# Patient Record
Sex: Male | Born: 1945 | Race: White | Hispanic: No | Smoking: Never smoker
Health system: Southern US, Community
[De-identification: ages and names within clinical notes are randomized; demographics above are authoritative.]

## PROBLEM LIST (undated history)

## (undated) DIAGNOSIS — R0989 Other specified symptoms and signs involving the circulatory and respiratory systems: Secondary | ICD-10-CM

## (undated) DIAGNOSIS — C801 Malignant (primary) neoplasm, unspecified: Secondary | ICD-10-CM

## (undated) DIAGNOSIS — E785 Hyperlipidemia, unspecified: Secondary | ICD-10-CM

## (undated) DIAGNOSIS — Z87442 Personal history of urinary calculi: Secondary | ICD-10-CM

## (undated) DIAGNOSIS — Q231 Congenital insufficiency of aortic valve: Secondary | ICD-10-CM

## (undated) DIAGNOSIS — R011 Cardiac murmur, unspecified: Secondary | ICD-10-CM

## (undated) DIAGNOSIS — M1990 Unspecified osteoarthritis, unspecified site: Secondary | ICD-10-CM

## (undated) DIAGNOSIS — N189 Chronic kidney disease, unspecified: Secondary | ICD-10-CM

## (undated) DIAGNOSIS — I480 Paroxysmal atrial fibrillation: Secondary | ICD-10-CM

## (undated) DIAGNOSIS — N200 Calculus of kidney: Secondary | ICD-10-CM

## (undated) DIAGNOSIS — I10 Essential (primary) hypertension: Secondary | ICD-10-CM

## (undated) HISTORY — PX: LITHOTRIPSY: SUR834

## (undated) HISTORY — DX: Hyperlipidemia, unspecified: E78.5

## (undated) HISTORY — DX: Other specified symptoms and signs involving the circulatory and respiratory systems: R09.89

## (undated) HISTORY — DX: Congenital insufficiency of aortic valve: Q23.1

## (undated) HISTORY — DX: Calculus of kidney: N20.0

## (undated) HISTORY — PX: EYE SURGERY: SHX253

## (undated) HISTORY — DX: Essential (primary) hypertension: I10

## (undated) HISTORY — PX: TONSILLECTOMY: SUR1361

---

## 2000-01-01 HISTORY — PX: SKIN CANCER EXCISION: SHX779

## 2013-01-05 ENCOUNTER — Encounter: Payer: Self-pay | Admitting: Vascular Surgery

## 2013-01-07 ENCOUNTER — Encounter: Payer: Self-pay | Admitting: Vascular Surgery

## 2013-01-08 ENCOUNTER — Ambulatory Visit (INDEPENDENT_AMBULATORY_CARE_PROVIDER_SITE_OTHER): Payer: Medicare Other | Admitting: Vascular Surgery

## 2013-01-08 ENCOUNTER — Encounter: Payer: Self-pay | Admitting: Vascular Surgery

## 2013-01-08 VITALS — BP 162/80 | HR 69 | Resp 20 | Ht 70.0 in | Wt 187.0 lb

## 2013-01-08 DIAGNOSIS — I6529 Occlusion and stenosis of unspecified carotid artery: Secondary | ICD-10-CM

## 2013-01-08 NOTE — Progress Notes (Signed)
Vascular and Vein Specialist of [facility]   Patient name: Kyle Kidd MRN: [id number] DOB: Sep 04, 1945 Sex: male   Referred by: Dr Jeanie Sewer  Reason for referral:  Chief Complaint  Patient presents with  . Carotid    New carotid bruit    HISTORY OF PRESENT ILLNESS: The patient is an active, pleasant 68 year old gentleman found to have a right carotid bruit by Dr. Jeanie Sewer. 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 He subsequently underwent an MRA again showing high-grade stenosis in his right internal carotid artery. I have reviewed the images from the MRA and the ultrasound. The patient describes no prior history of stroke, TIA or a fascia or amaurosis fugax. He has no history of significant coronary disease. He remains quite active.  Past Medical History  Diagnosis Date  . Hyperlipidemia   . Hypertension   . Bicuspid aortic valve   . Renal calculi     X2  . Carotid bruit     right    Past Surgical History  Procedure Date  . Skin cancer excision 2001    back  . Lithotripsy     x1    History   Social History  . Marital Status: Widowed    Spouse Name: N/A    Number of Children: N/A  . Years of Education: N/A   Occupational History  . Not on file.   Social History Main Topics  . Smoking status: Never Smoker   . Smokeless tobacco: Never Used  . Alcohol Use: No  . Drug Use: No  . Sexually Active: Not on file   Other Topics Concern  . Not on file   Social History Narrative  . No narrative on file    No family history on file.  Allergies as of 01/08/2013  . (No Known Allergies)    Current Outpatient Prescriptions on File Prior to Visit  Medication Sig Dispense Refill  . aspirin 81 MG tablet Take 81 mg by mouth daily.      Marland Kitchen olmesartan-hydrochlorothiazide (BENICAR HCT) 40-12.5 MG per tablet Take by mouth. Take 1/2 tablet every other day      . Omega-3 Fatty Acids (FISH OIL) 1200 MG  CAPS Take 2 capsules by mouth daily.      . rosuvastatin (CRESTOR) 10 MG tablet Take 20 mg by mouth. Take 1/2 daily HS      . HYDROcodone-acetaminophen (LORCET) 10-650 MG per tablet Take 1 tablet by mouth every 6 (six) hours as needed.         REVIEW OF SYSTEMS:  Positives indicated with an "X"  CARDIOVASCULAR:  [ ]  chest pain   [ ]  chest pressure   [ ]  palpitations   [ ]  orthopnea   [ ]  dyspnea on exertion   [ ]  claudication   [ ]  rest pain   [ ]  DVT   [ ]  phlebitis PULMONARY:   [ ]  productive cough   [ ]  asthma   [ ]  wheezing NEUROLOGIC:   [ ]  weakness  [ ]  paresthesias  [ ]  aphasia  [ ]  amaurosis  [ ]  dizziness HEMATOLOGIC:   [ ]  bleeding problems   [ ]  clotting disorders MUSCULOSKELETAL:  [ ]  joint pain   [ ]  joint swelling GASTROINTESTINAL: [ ]   blood in stool  [ ]   hematemesis GENITOURINARY:  [ ]   dysuria  [ ]   hematuria PSYCHIATRIC:  [ ]  history of major depression INTEGUMENTARY:  [ ]  rashes  [ ]   ulcers CONSTITUTIONAL:  [ ]  fever   [ ]  chills  PHYSICAL EXAMINATION:  General: The patient is a well-nourished male, in no acute distress. Vital signs are BP 162/80  Pulse 69  Resp 20  Ht 5\' 10"  (1.778 m)  Wt 187 lb (84.823 kg)  BMI 26.83 kg/m2 Pulmonary: There is a good air exchange bilaterally without wheezing or rales. Abdomen: Soft and non-tender with normal pitch bowel sounds. Musculoskeletal: There are no major deformities.  There is no significant extremity pain. Neurologic: No focal weakness or paresthesias are detected, Skin: There are no ulcer or rashes noted. Psychiatric: The patient has normal affect. Cardiovascular: There is a regular rate and rhythm without significant murmur appreciated. He does have a soft right carotid bruit and no breathing on the left. Pulse status is 2+ radial femoral popliteal and 2+ posterior tibial pulses bilaterally   Vascular Lab Studies:  Carotid duplex at [facility] shows markedly elevated end-diastolic velocitiesi. MRA was  reviewed also showing calcific plaque at the bifurcation. There was some nonflow limiting narrowing in the common carotid artery proximally.  Impression and Plan:  Asymptomatic greater than 75% right internal carotid artery stenosis. I had a long discussion with the patient explaining the significance of this. I explained his approximate 5% per year risk of stroke or transient ischemic attack. I explained a surgical procedure for correcting this. I explained the potential the 1-1/2% risk of stroke with carotid surgery. Also explained the slight risk for cranial nerve injury. I have recommended endarterectomy for reduction of stroke risk. He understands and wishes to consider this and will notify us if he wishes to proceed with surgery    Manar Smalling V[facility] and Vein Specialists of [place] Office: [phone]

## 2013-01-19 ENCOUNTER — Telehealth: Payer: Self-pay | Admitting: *Deleted

## 2013-01-19 NOTE — Telephone Encounter (Signed)
Josten called to say he did not want to schedule surgery at this time. He said he would contact us when he was ready to schedule.I reiterated the signs & symptoms of a stroke and to go to the ER if experienced any. He said he understood.

## 2015-03-29 DIAGNOSIS — I6529 Occlusion and stenosis of unspecified carotid artery: Secondary | ICD-10-CM | POA: Diagnosis not present

## 2015-03-29 DIAGNOSIS — N183 Chronic kidney disease, stage 3 (moderate): Secondary | ICD-10-CM | POA: Diagnosis not present

## 2015-03-29 DIAGNOSIS — I129 Hypertensive chronic kidney disease with stage 1 through stage 4 chronic kidney disease, or unspecified chronic kidney disease: Secondary | ICD-10-CM | POA: Diagnosis not present

## 2015-03-29 DIAGNOSIS — E785 Hyperlipidemia, unspecified: Secondary | ICD-10-CM | POA: Diagnosis not present

## 2015-03-29 DIAGNOSIS — D649 Anemia, unspecified: Secondary | ICD-10-CM | POA: Diagnosis not present

## 2015-05-16 DIAGNOSIS — H25813 Combined forms of age-related cataract, bilateral: Secondary | ICD-10-CM | POA: Diagnosis not present

## 2015-10-04 DIAGNOSIS — Z79899 Other long term (current) drug therapy: Secondary | ICD-10-CM | POA: Diagnosis not present

## 2015-10-04 DIAGNOSIS — E785 Hyperlipidemia, unspecified: Secondary | ICD-10-CM | POA: Diagnosis not present

## 2015-10-04 DIAGNOSIS — N183 Chronic kidney disease, stage 3 (moderate): Secondary | ICD-10-CM | POA: Diagnosis not present

## 2015-10-04 DIAGNOSIS — I129 Hypertensive chronic kidney disease with stage 1 through stage 4 chronic kidney disease, or unspecified chronic kidney disease: Secondary | ICD-10-CM | POA: Diagnosis not present

## 2016-04-10 DIAGNOSIS — Z1389 Encounter for screening for other disorder: Secondary | ICD-10-CM | POA: Diagnosis not present

## 2016-04-10 DIAGNOSIS — R5383 Other fatigue: Secondary | ICD-10-CM | POA: Diagnosis not present

## 2016-04-10 DIAGNOSIS — D510 Vitamin B12 deficiency anemia due to intrinsic factor deficiency: Secondary | ICD-10-CM | POA: Diagnosis not present

## 2016-04-10 DIAGNOSIS — Z0000 Encounter for general adult medical examination without abnormal findings: Secondary | ICD-10-CM | POA: Diagnosis not present

## 2016-04-10 DIAGNOSIS — I10 Essential (primary) hypertension: Secondary | ICD-10-CM | POA: Diagnosis not present

## 2016-04-10 DIAGNOSIS — E785 Hyperlipidemia, unspecified: Secondary | ICD-10-CM | POA: Diagnosis not present

## 2016-04-12 DIAGNOSIS — R5383 Other fatigue: Secondary | ICD-10-CM | POA: Diagnosis not present

## 2016-04-18 DIAGNOSIS — D510 Vitamin B12 deficiency anemia due to intrinsic factor deficiency: Secondary | ICD-10-CM | POA: Diagnosis not present

## 2016-04-30 DIAGNOSIS — D510 Vitamin B12 deficiency anemia due to intrinsic factor deficiency: Secondary | ICD-10-CM | POA: Diagnosis not present

## 2016-05-14 DIAGNOSIS — D510 Vitamin B12 deficiency anemia due to intrinsic factor deficiency: Secondary | ICD-10-CM | POA: Diagnosis not present

## 2016-05-24 DIAGNOSIS — H25813 Combined forms of age-related cataract, bilateral: Secondary | ICD-10-CM | POA: Diagnosis not present

## 2016-07-11 DIAGNOSIS — H02834 Dermatochalasis of left upper eyelid: Secondary | ICD-10-CM | POA: Diagnosis not present

## 2016-07-11 DIAGNOSIS — H02831 Dermatochalasis of right upper eyelid: Secondary | ICD-10-CM | POA: Diagnosis not present

## 2016-08-08 ENCOUNTER — Other Ambulatory Visit: Payer: Self-pay | Admitting: Pharmacist

## 2016-08-08 NOTE — Patient Outreach (Signed)
Outreach call to Kyle Kidd regarding his request for follow up from the [facility] Medication Adherence Campaign. Left a HIPAA compliant message on the patient's voicemail.  Harlow Asa, PharmD Clinical Pharmacist [facility] [phone]

## 2016-08-21 DIAGNOSIS — Z7982 Long term (current) use of aspirin: Secondary | ICD-10-CM | POA: Diagnosis not present

## 2016-08-21 DIAGNOSIS — I6529 Occlusion and stenosis of unspecified carotid artery: Secondary | ICD-10-CM | POA: Diagnosis not present

## 2016-08-21 DIAGNOSIS — E785 Hyperlipidemia, unspecified: Secondary | ICD-10-CM | POA: Diagnosis not present

## 2016-08-21 DIAGNOSIS — Z79899 Other long term (current) drug therapy: Secondary | ICD-10-CM | POA: Diagnosis not present

## 2016-08-21 DIAGNOSIS — I129 Hypertensive chronic kidney disease with stage 1 through stage 4 chronic kidney disease, or unspecified chronic kidney disease: Secondary | ICD-10-CM | POA: Diagnosis not present

## 2016-08-21 DIAGNOSIS — N183 Chronic kidney disease, stage 3 (moderate): Secondary | ICD-10-CM | POA: Diagnosis not present

## 2016-09-05 DIAGNOSIS — H02834 Dermatochalasis of left upper eyelid: Secondary | ICD-10-CM | POA: Diagnosis not present

## 2016-09-05 DIAGNOSIS — H02831 Dermatochalasis of right upper eyelid: Secondary | ICD-10-CM | POA: Diagnosis not present

## 2016-09-05 DIAGNOSIS — Z01818 Encounter for other preprocedural examination: Secondary | ICD-10-CM | POA: Diagnosis not present

## 2016-09-05 DIAGNOSIS — H53453 Other localized visual field defect, bilateral: Secondary | ICD-10-CM | POA: Diagnosis not present

## 2017-05-30 DIAGNOSIS — H25813 Combined forms of age-related cataract, bilateral: Secondary | ICD-10-CM | POA: Diagnosis not present

## 2017-06-25 DIAGNOSIS — Z7982 Long term (current) use of aspirin: Secondary | ICD-10-CM | POA: Diagnosis not present

## 2017-06-25 DIAGNOSIS — I6529 Occlusion and stenosis of unspecified carotid artery: Secondary | ICD-10-CM | POA: Diagnosis not present

## 2017-06-25 DIAGNOSIS — N183 Chronic kidney disease, stage 3 (moderate): Secondary | ICD-10-CM | POA: Diagnosis not present

## 2017-06-25 DIAGNOSIS — I959 Hypotension, unspecified: Secondary | ICD-10-CM | POA: Diagnosis not present

## 2017-06-25 DIAGNOSIS — E785 Hyperlipidemia, unspecified: Secondary | ICD-10-CM | POA: Diagnosis not present

## 2017-06-25 DIAGNOSIS — I129 Hypertensive chronic kidney disease with stage 1 through stage 4 chronic kidney disease, or unspecified chronic kidney disease: Secondary | ICD-10-CM | POA: Diagnosis not present

## 2017-06-25 DIAGNOSIS — Z79899 Other long term (current) drug therapy: Secondary | ICD-10-CM | POA: Diagnosis not present

## 2017-06-25 DIAGNOSIS — M791 Myalgia: Secondary | ICD-10-CM | POA: Diagnosis not present

## 2017-08-01 DIAGNOSIS — M1611 Unilateral primary osteoarthritis, right hip: Secondary | ICD-10-CM | POA: Diagnosis not present

## 2017-08-01 DIAGNOSIS — Z6827 Body mass index (BMI) 27.0-27.9, adult: Secondary | ICD-10-CM | POA: Diagnosis not present

## 2017-09-03 ENCOUNTER — Ambulatory Visit (INDEPENDENT_AMBULATORY_CARE_PROVIDER_SITE_OTHER): Payer: Medicare HMO | Admitting: Orthopaedic Surgery

## 2017-09-03 DIAGNOSIS — M25551 Pain in right hip: Secondary | ICD-10-CM

## 2017-09-03 DIAGNOSIS — M25552 Pain in left hip: Secondary | ICD-10-CM | POA: Insufficient documentation

## 2017-09-03 DIAGNOSIS — M1611 Unilateral primary osteoarthritis, right hip: Secondary | ICD-10-CM | POA: Diagnosis not present

## 2017-09-03 NOTE — Progress Notes (Signed)
Office Visit Note   Patient: Kyle Kidd           Date of Birth: 03/30/1945           MRN: [id number] Visit Date: 09/03/2017              Requested by: Angelina Sheriff, MD [place], [place] [address] PCP: Angelina Sheriff, MD   Assessment & Plan: Visit Diagnoses:  1. Unilateral primary osteoarthritis, right hip   2. Pain of right hip joint     Plan:Due to the severity of his hip disease and we are recommending a right total hip arthroplasty. The x-ray shows severe loss of the joint space at this point all conservative treatment measures have failed. I spent a considerable amount of time showing a hip model and going over his x-rays. We talked about the risks and benefits of the surgery as well as what is intraoperative and postoperative course with involved. He does wish to have this set up in the near future. All questions were encouraged and answered. We would see him back in 2 weeks postoperative but no x-rays of be needed.  Follow-Up Instructions: Return for 2 weeks post-op.   Orders:  No orders of the defined types were placed in this encounter.  No orders of the defined types were placed in this encounter.     Procedures: No procedures performed   Clinical Data: No additional findings.   Subjective: No chief complaint on file. Patient is very pleasant 72 year old sent from his primary care physicians at [facility] to evaluate severe arthritis of his right hip incision to his thighs. Walking hurts him constantly. His treatment is limited in terms of anti-inflammatories due to chronic renal insufficiency and he cannot take NSAIDs. He is tried an assistive device. He is tried activity modification. His pain is daily and it is detrimentally affected his activities daily living, his quality of life, and his mobility. He has not had intra-articular steroid injection however there is no joint space now to place an injection. He's  been treated for well over a year now for the severity of his pain. On thinking take now is narcotics.  HPI  Review of Systems He denies any headache, chest pain, short of breath, fever, chills, nausea, vomiting.  Objective: Vital Signs: There were no vitals taken for this visit.  Physical Exam He is alert and oriented 3 and in no acute distress Ortho Exam He also has significant Trendelenburg gait favoring his right hip. His leg lengths show the slightly shorter on the right than the left. He essentially has no internal or external rotation on the right hip either. Specialty Comments:  No specialty comments available.  Imaging: No results found. X-rays that accompany him and are independently reviewed by me today show severe osteoporotic and degenerative joint disease of the right hip. There is essentially no joint space left. It is completely bone-on-bone wear. There particular osteophytes and sclerotic changes as well.  PMFS History: Patient Active Problem List   Diagnosis Date Noted  . Unilateral primary osteoarthritis, right hip 09/03/2017  . Pain of right hip joint 09/03/2017  . Occlusion and stenosis of carotid artery without mention of cerebral infarction 01/08/2013   Past Medical History:  Diagnosis Date  . Bicuspid aortic valve   . Carotid bruit    right  . Hyperlipidemia   . Hypertension   . Renal calculi    X2  No family history on file.  Past Surgical History:  Procedure Laterality Date  . LITHOTRIPSY     x1  . SKIN CANCER EXCISION  2001   back   Social History   Occupational History  . Not on file.   Social History Main Topics  . Smoking status: Never Smoker  . Smokeless tobacco: Never Used  . Alcohol use No  . Drug use: No  . Sexual activity: Not on file

## 2017-09-19 ENCOUNTER — Other Ambulatory Visit (INDEPENDENT_AMBULATORY_CARE_PROVIDER_SITE_OTHER): Payer: Self-pay | Admitting: Orthopaedic Surgery

## 2017-09-19 DIAGNOSIS — M1611 Unilateral primary osteoarthritis, right hip: Secondary | ICD-10-CM

## 2017-09-23 NOTE — Pre-Procedure Instructions (Signed)
Kyle Kidd  09/23/2017      [facility] - [place], [place] [address] [place] [address] Phone: [phone] Fax: [phone]    Your procedure is scheduled on Tuesday, October 01, 2017  Report to [facility] Admitting Entrance "A" at 12:45 P.M.   Call this number if you have problems the morning of surgery:  [phone]   Remember:  Do not eat food or drink liquids after midnight.  Take these medicines the morning of surgery with A SIP OF WATER: If needed Acetaminophen (TYLENOL) for pain.  7 days before surgery stop taking all Aspirins, Vitamins, Fish oils, and Herbal medications. Also stop all NSAIDS i.e. Advil, Motrin, Aleve, Anaprox, Naproxen, BC and Goody Powders.   Do not wear jewelry.  Do not wear lotions, powders, or perfumes, or deodorant.  Do not shave 48 hours prior to surgery.  Men may shave face and neck.  Do not bring valuables to the hospital.  [facility] is not responsible for any belongings or valuables.  Contacts, dentures or bridgework may not be worn into surgery.  Leave your suitcase in the car.  After surgery it may be brought to your room.  For patients admitted to the hospital, discharge time will be determined by your treatment team.  Patients discharged the day of surgery will not be allowed to drive home.   Special instructions:  [facility]- Preparing For Surgery  Before surgery, you can play an important role. Because skin is not sterile, your skin needs to be as free of germs as possible. You can reduce the number of germs on your skin by washing with CHG (chlorahexidine gluconate) Soap before surgery.  CHG is an antiseptic cleaner which kills germs and bonds with the skin to continue killing germs even after washing.  Please do not use if you have an allergy to CHG or antibacterial soaps. If your skin becomes reddened/irritated stop using the CHG.  Do not shave (including legs and  underarms) for at least 48 hours prior to first CHG shower. It is OK to shave your face.  Please follow these instructions carefully.   1. Shower the NIGHT BEFORE SURGERY and the MORNING OF SURGERY with CHG.   2. If you chose to wash your hair, wash your hair first as usual with your normal shampoo.  3. After you shampoo, rinse your hair and body thoroughly to remove the shampoo.  4. Use CHG as you would any other liquid soap. You can apply CHG directly to the skin and wash gently with a scrungie or a clean washcloth.   5. Apply the CHG Soap to your body ONLY FROM THE NECK DOWN.  Do not use on open wounds or open sores. Avoid contact with your eyes, ears, mouth and genitals (private parts). Wash genitals (private parts) with your normal soap.  6. Wash thoroughly, paying special attention to the area where your surgery will be performed.  7. Thoroughly rinse your body with warm water from the neck down.  8. DO NOT shower/wash with your normal soap after using and rinsing off the CHG Soap.  9. Pat yourself dry with a CLEAN TOWEL.   10. Wear CLEAN PAJAMAS   11. Place CLEAN SHEETS on your bed the night of your first shower and DO NOT SLEEP WITH PETS.  Day of Surgery: Do not apply any deodorants/lotions. Please wear clean clothes to the hospital/surgery center.  Please read over the following fact sheets that you were given. Pain Booklet, Coughing and Deep Breathing, MRSA Information and Surgical Site Infection Prevention

## 2017-09-24 ENCOUNTER — Encounter (HOSPITAL_COMMUNITY)
Admission: RE | Admit: 2017-09-24 | Discharge: 2017-09-24 | Disposition: A | Discharge status: Home / Self Care | Payer: Medicare HMO | Source: Ambulatory Visit | Attending: Orthopaedic Surgery | Admitting: Orthopaedic Surgery

## 2017-09-24 ENCOUNTER — Encounter (HOSPITAL_COMMUNITY): Payer: Self-pay

## 2017-09-24 DIAGNOSIS — Z01818 Encounter for other preprocedural examination: Secondary | ICD-10-CM | POA: Diagnosis not present

## 2017-09-24 HISTORY — DX: Malignant (primary) neoplasm, unspecified: C80.1

## 2017-09-24 HISTORY — DX: Cardiac murmur, unspecified: R01.1

## 2017-09-24 HISTORY — DX: Personal history of urinary calculi: Z87.442

## 2017-09-24 HISTORY — DX: Unspecified osteoarthritis, unspecified site: M19.90

## 2017-09-24 LAB — BASIC METABOLIC PANEL
ANION GAP: 8 (ref 5–15)
BUN: 20 mg/dL (ref 6–20)
CALCIUM: 9.4 mg/dL (ref 8.9–10.3)
CO2: 26 mmol/L (ref 22–32)
CREATININE: 1.58 mg/dL — AB (ref 0.61–1.24)
Chloride: 105 mmol/L (ref 101–111)
GFR, EST AFRICAN AMERICAN: 49 mL/min — AB (ref >60)
GFR, EST NON AFRICAN AMERICAN: 42 mL/min — AB (ref >60)
Glucose, Bld: 119 mg/dL — ABNORMAL HIGH (ref 65–99)
Potassium: 4.1 mmol/L (ref 3.5–5.1)
Sodium: 139 mmol/L (ref 135–145)

## 2017-09-24 LAB — CBC
HCT: 43.8 % (ref 39.0–52.0)
HEMOGLOBIN: 14.8 g/dL (ref 13.0–17.0)
MCH: 33.1 pg (ref 26.0–34.0)
MCHC: 33.8 g/dL (ref 30.0–36.0)
MCV: 98 fL (ref 78.0–100.0)
PLATELETS: 294 10*3/uL (ref 150–400)
RBC: 4.47 MIL/uL (ref 4.22–5.81)
RDW: 13.7 % (ref 11.5–15.5)
WBC: 5.8 10*3/uL (ref 4.0–10.5)

## 2017-09-24 LAB — SURGICAL PCR SCREEN
MRSA, PCR: NEGATIVE
STAPHYLOCOCCUS AUREUS: NEGATIVE

## 2017-09-24 NOTE — Progress Notes (Addendum)
PCP is Dr. Lin Landsman in [place] 07/2017 States he was told as 'youngun' he had murmur.  No mention of it now with PCP, not even when he went into [employer] [facility] [employer]. Denies any cardiac issues or tests.  Has not seen a cardio. Has h/o of kidney stones. (15-17 yrs now), has some insufficiency.  Goes to [place] saw a Dr. Lauretta Chester back in June or July 2018.  Stays away from NSAIDS.   Been told he has some plaque buildup in carotid.

## 2017-09-25 ENCOUNTER — Other Ambulatory Visit (INDEPENDENT_AMBULATORY_CARE_PROVIDER_SITE_OTHER): Payer: Self-pay | Admitting: Orthopaedic Surgery

## 2017-09-30 MED ORDER — CEFAZOLIN SODIUM-DEXTROSE 2-4 GM/100ML-% IV SOLN
2.0000 g | INTRAVENOUS | Status: AC
  Administered 2017-10-01: 2 g via INTRAVENOUS
  Filled 2017-09-30: qty 100

## 2017-09-30 MED ORDER — TRANEXAMIC ACID 1000 MG/10ML IV SOLN
1000.0000 mg | INTRAVENOUS | Status: AC
  Administered 2017-10-01: 1000 mg via INTRAVENOUS
  Filled 2017-09-30: qty 1100

## 2017-10-01 ENCOUNTER — Inpatient Hospital Stay (HOSPITAL_COMMUNITY): Payer: Medicare HMO

## 2017-10-01 ENCOUNTER — Encounter (HOSPITAL_COMMUNITY): Payer: Self-pay

## 2017-10-01 ENCOUNTER — Inpatient Hospital Stay (HOSPITAL_COMMUNITY): Payer: Medicare HMO | Admitting: Anesthesiology

## 2017-10-01 ENCOUNTER — Encounter (HOSPITAL_COMMUNITY)
Admission: RE | Disposition: A | Discharge status: Home Health | Payer: Self-pay | Source: Ambulatory Visit | Attending: Orthopaedic Surgery

## 2017-10-01 ENCOUNTER — Inpatient Hospital Stay (HOSPITAL_COMMUNITY)
Admission: RE | Admit: 2017-10-01 | Discharge: 2017-10-03 | DRG: 470 | Disposition: A | Discharge status: Home Health | Payer: Medicare HMO | Source: Ambulatory Visit | Attending: Orthopaedic Surgery | Admitting: Orthopaedic Surgery

## 2017-10-01 DIAGNOSIS — R011 Cardiac murmur, unspecified: Secondary | ICD-10-CM | POA: Diagnosis not present

## 2017-10-01 DIAGNOSIS — Z87442 Personal history of urinary calculi: Secondary | ICD-10-CM | POA: Diagnosis not present

## 2017-10-01 DIAGNOSIS — M1611 Unilateral primary osteoarthritis, right hip: Principal | ICD-10-CM | POA: Diagnosis present

## 2017-10-01 DIAGNOSIS — Z85828 Personal history of other malignant neoplasm of skin: Secondary | ICD-10-CM | POA: Diagnosis not present

## 2017-10-01 DIAGNOSIS — I739 Peripheral vascular disease, unspecified: Secondary | ICD-10-CM | POA: Diagnosis present

## 2017-10-01 DIAGNOSIS — Z79899 Other long term (current) drug therapy: Secondary | ICD-10-CM

## 2017-10-01 DIAGNOSIS — M25551 Pain in right hip: Secondary | ICD-10-CM | POA: Diagnosis present

## 2017-10-01 DIAGNOSIS — R0989 Other specified symptoms and signs involving the circulatory and respiratory systems: Secondary | ICD-10-CM | POA: Diagnosis present

## 2017-10-01 DIAGNOSIS — Z96641 Presence of right artificial hip joint: Secondary | ICD-10-CM | POA: Diagnosis not present

## 2017-10-01 DIAGNOSIS — Z471 Aftercare following joint replacement surgery: Secondary | ICD-10-CM | POA: Diagnosis not present

## 2017-10-01 DIAGNOSIS — Z96643 Presence of artificial hip joint, bilateral: Secondary | ICD-10-CM | POA: Diagnosis not present

## 2017-10-01 DIAGNOSIS — I6529 Occlusion and stenosis of unspecified carotid artery: Secondary | ICD-10-CM | POA: Diagnosis not present

## 2017-10-01 DIAGNOSIS — I10 Essential (primary) hypertension: Secondary | ICD-10-CM | POA: Diagnosis present

## 2017-10-01 DIAGNOSIS — Z419 Encounter for procedure for purposes other than remedying health state, unspecified: Secondary | ICD-10-CM

## 2017-10-01 HISTORY — PX: TOTAL HIP ARTHROPLASTY: SHX124

## 2017-10-01 SURGERY — ARTHROPLASTY, HIP, TOTAL, ANTERIOR APPROACH
Anesthesia: Spinal | Site: Hip | Laterality: Right

## 2017-10-01 MED ORDER — SODIUM CHLORIDE 0.9 % IR SOLN
Status: DC | PRN
  Administered 2017-10-01: 3000 mL

## 2017-10-01 MED ORDER — MEPERIDINE HCL 25 MG/ML IJ SOLN: 6.2500 mg | INTRAMUSCULAR | Status: DC | PRN

## 2017-10-01 MED ORDER — CEFAZOLIN SODIUM-DEXTROSE 1-4 GM/50ML-% IV SOLN
1.0000 g | Freq: Four times a day (QID) | INTRAVENOUS | Status: AC
  Administered 2017-10-01 – 2017-10-02 (×2): 1 g via INTRAVENOUS
  Filled 2017-10-01 (×2): qty 50

## 2017-10-01 MED ORDER — DIPHENHYDRAMINE HCL 12.5 MG/5ML PO ELIX: 12.5000 mg | ORAL_SOLUTION | ORAL | Status: DC | PRN

## 2017-10-01 MED ORDER — ALBUMIN HUMAN 5 % IV SOLN
INTRAVENOUS | Status: DC | PRN
  Administered 2017-10-01: 16:00:00 via INTRAVENOUS

## 2017-10-01 MED ORDER — ROCURONIUM BROMIDE 10 MG/ML (PF) SYRINGE
PREFILLED_SYRINGE | INTRAVENOUS | Status: AC
  Filled 2017-10-01: qty 5

## 2017-10-01 MED ORDER — LIDOCAINE HCL (CARDIAC) 20 MG/ML IV SOLN
INTRAVENOUS | Status: DC | PRN
  Administered 2017-10-01: 80 mg via INTRAVENOUS

## 2017-10-01 MED ORDER — SODIUM CHLORIDE 0.9 % IV SOLN
INTRAVENOUS | Status: DC
  Administered 2017-10-01: 21:00:00 via INTRAVENOUS

## 2017-10-01 MED ORDER — IRBESARTAN 75 MG PO TABS
37.5000 mg | ORAL_TABLET | Freq: Every day | ORAL | Status: DC
  Administered 2017-10-02 – 2017-10-03 (×2): 37.5 mg via ORAL
  Filled 2017-10-01 (×3): qty 0.5

## 2017-10-01 MED ORDER — ACETAMINOPHEN 650 MG RE SUPP: 650.0000 mg | Freq: Four times a day (QID) | RECTAL | Status: DC | PRN

## 2017-10-01 MED ORDER — ALUM & MAG HYDROXIDE-SIMETH 200-200-20 MG/5ML PO SUSP: 30.0000 mL | ORAL | Status: DC | PRN

## 2017-10-01 MED ORDER — HYDROMORPHONE HCL 1 MG/ML IJ SOLN: 0.2500 mg | INTRAMUSCULAR | Status: DC | PRN

## 2017-10-01 MED ORDER — FENTANYL CITRATE (PF) 250 MCG/5ML IJ SOLN
INTRAMUSCULAR | Status: AC
  Filled 2017-10-01: qty 5

## 2017-10-01 MED ORDER — HYDROMORPHONE HCL 1 MG/ML IJ SOLN
1.0000 mg | INTRAMUSCULAR | Status: DC | PRN
  Administered 2017-10-02 (×2): 1 mg via INTRAVENOUS
  Filled 2017-10-01 (×2): qty 1

## 2017-10-01 MED ORDER — PROPOFOL 500 MG/50ML IV EMUL
INTRAVENOUS | Status: DC | PRN
  Administered 2017-10-01: 100 ug/kg/min via INTRAVENOUS

## 2017-10-01 MED ORDER — PROPOFOL 10 MG/ML IV BOLUS
INTRAVENOUS | Status: DC | PRN
  Administered 2017-10-01: 40 mg via INTRAVENOUS

## 2017-10-01 MED ORDER — POLYETHYLENE GLYCOL 3350 17 G PO PACK: 17.0000 g | PACK | Freq: Every day | ORAL | Status: DC | PRN

## 2017-10-01 MED ORDER — EPHEDRINE SULFATE 50 MG/ML IJ SOLN
INTRAMUSCULAR | Status: DC | PRN
  Administered 2017-10-01 (×2): 10 mg via INTRAVENOUS

## 2017-10-01 MED ORDER — ASPIRIN 81 MG PO CHEW
81.0000 mg | CHEWABLE_TABLET | Freq: Two times a day (BID) | ORAL | Status: DC
  Administered 2017-10-01 – 2017-10-03 (×4): 81 mg via ORAL
  Filled 2017-10-01 (×4): qty 1

## 2017-10-01 MED ORDER — BUPIVACAINE IN DEXTROSE 0.75-8.25 % IT SOLN
INTRATHECAL | Status: DC | PRN
  Administered 2017-10-01: 2 mL via INTRATHECAL

## 2017-10-01 MED ORDER — GLYCOPYRROLATE 0.2 MG/ML IJ SOLN
INTRAMUSCULAR | Status: DC | PRN
  Administered 2017-10-01: 0.2 mg via INTRAVENOUS

## 2017-10-01 MED ORDER — METOCLOPRAMIDE HCL 5 MG PO TABS: 5.0000 mg | ORAL_TABLET | Freq: Three times a day (TID) | ORAL | Status: DC | PRN

## 2017-10-01 MED ORDER — OXYCODONE HCL 5 MG PO TABS
5.0000 mg | ORAL_TABLET | ORAL | Status: DC | PRN
  Administered 2017-10-01 – 2017-10-03 (×4): 10 mg via ORAL
  Filled 2017-10-01 (×4): qty 2

## 2017-10-01 MED ORDER — METHOCARBAMOL 500 MG PO TABS
500.0000 mg | ORAL_TABLET | Freq: Four times a day (QID) | ORAL | Status: DC | PRN
  Administered 2017-10-01: 500 mg via ORAL
  Filled 2017-10-01: qty 1

## 2017-10-01 MED ORDER — ONDANSETRON HCL 4 MG/2ML IJ SOLN: 4.0000 mg | Freq: Four times a day (QID) | INTRAMUSCULAR | Status: DC | PRN

## 2017-10-01 MED ORDER — EPHEDRINE 5 MG/ML INJ
INTRAVENOUS | Status: AC
  Filled 2017-10-01: qty 10

## 2017-10-01 MED ORDER — ACETAMINOPHEN 325 MG PO TABS
650.0000 mg | ORAL_TABLET | Freq: Four times a day (QID) | ORAL | Status: DC | PRN
  Administered 2017-10-01 – 2017-10-03 (×4): 650 mg via ORAL
  Filled 2017-10-01 (×4): qty 2

## 2017-10-01 MED ORDER — MIDAZOLAM HCL 2 MG/2ML IJ SOLN
INTRAMUSCULAR | Status: AC
  Filled 2017-10-01: qty 2

## 2017-10-01 MED ORDER — 0.9 % SODIUM CHLORIDE (POUR BTL) OPTIME
TOPICAL | Status: DC | PRN
  Administered 2017-10-01: 1000 mL

## 2017-10-01 MED ORDER — ONDANSETRON HCL 4 MG/2ML IJ SOLN
INTRAMUSCULAR | Status: DC | PRN
  Administered 2017-10-01: 4 mg via INTRAVENOUS

## 2017-10-01 MED ORDER — DEXTROSE 5 % IV SOLN
500.0000 mg | Freq: Four times a day (QID) | INTRAVENOUS | Status: DC | PRN
Start: 2017-10-01 — End: 2017-10-03
  Filled 2017-10-01: qty 5

## 2017-10-01 MED ORDER — PHENYLEPHRINE 40 MCG/ML (10ML) SYRINGE FOR IV PUSH (FOR BLOOD PRESSURE SUPPORT)
PREFILLED_SYRINGE | INTRAVENOUS | Status: AC
  Filled 2017-10-01: qty 50

## 2017-10-01 MED ORDER — PHENOL 1.4 % MT LIQD
1.0000 | OROMUCOSAL | Status: DC | PRN
  Administered 2017-10-02: 1 via OROMUCOSAL
  Filled 2017-10-01: qty 177

## 2017-10-01 MED ORDER — PROPOFOL 10 MG/ML IV BOLUS
INTRAVENOUS | Status: AC
  Filled 2017-10-01: qty 20

## 2017-10-01 MED ORDER — FENTANYL CITRATE (PF) 100 MCG/2ML IJ SOLN
INTRAMUSCULAR | Status: DC | PRN
  Administered 2017-10-01: 25 ug via INTRAVENOUS

## 2017-10-01 MED ORDER — LACTATED RINGERS IV SOLN
INTRAVENOUS | Status: DC
  Administered 2017-10-01 (×3): via INTRAVENOUS

## 2017-10-01 MED ORDER — MENTHOL 3 MG MT LOZG: 1.0000 | LOZENGE | OROMUCOSAL | Status: DC | PRN

## 2017-10-01 MED ORDER — MIDAZOLAM HCL 2 MG/2ML IJ SOLN
INTRAMUSCULAR | Status: DC | PRN
  Administered 2017-10-01: 2 mg via INTRAVENOUS

## 2017-10-01 MED ORDER — METOCLOPRAMIDE HCL 5 MG/ML IJ SOLN: 5.0000 mg | Freq: Three times a day (TID) | INTRAMUSCULAR | Status: DC | PRN

## 2017-10-01 MED ORDER — DOCUSATE SODIUM 100 MG PO CAPS
100.0000 mg | ORAL_CAPSULE | Freq: Two times a day (BID) | ORAL | Status: DC
  Administered 2017-10-01 – 2017-10-03 (×4): 100 mg via ORAL
  Filled 2017-10-01 (×4): qty 1

## 2017-10-01 MED ORDER — ONDANSETRON HCL 4 MG PO TABS: 4.0000 mg | ORAL_TABLET | Freq: Four times a day (QID) | ORAL | Status: DC | PRN

## 2017-10-01 MED ORDER — PHENYLEPHRINE HCL 10 MG/ML IJ SOLN
INTRAMUSCULAR | Status: DC | PRN
  Administered 2017-10-01 (×4): 80 ug via INTRAVENOUS
  Administered 2017-10-01: 40 ug via INTRAVENOUS
  Administered 2017-10-01: 120 ug via INTRAVENOUS
  Administered 2017-10-01 (×3): 80 ug via INTRAVENOUS
  Administered 2017-10-01: 120 ug via INTRAVENOUS

## 2017-10-01 SURGICAL SUPPLY — 53 items
BENZOIN TINCTURE PRP APPL 2/3 (GAUZE/BANDAGES/DRESSINGS) ×3 IMPLANT
BLADE CLIPPER SURG (BLADE) IMPLANT
BLADE SAW SGTL 18X1.27X75 (BLADE) ×2 IMPLANT
BLADE SAW SGTL 18X1.27X75MM (BLADE) ×1
CAPT HIP TOTAL 2 ×3 IMPLANT
CELLS DAT CNTRL 66122 CELL SVR (MISCELLANEOUS) ×1 IMPLANT
CLOSURE STERI-STRIP 1/2X4 (GAUZE/BANDAGES/DRESSINGS) ×1
CLOSURE WOUND 1/2 X4 (GAUZE/BANDAGES/DRESSINGS) ×2
CLSR STERI-STRIP ANTIMIC 1/2X4 (GAUZE/BANDAGES/DRESSINGS) ×2 IMPLANT
COVER SURGICAL LIGHT HANDLE (MISCELLANEOUS) ×3 IMPLANT
DRAPE C-ARM 42X72 X-RAY (DRAPES) ×3 IMPLANT
DRAPE STERI IOBAN 125X83 (DRAPES) ×3 IMPLANT
DRAPE U-SHAPE 47X51 STRL (DRAPES) ×9 IMPLANT
DRSG AQUACEL AG ADV 3.5X10 (GAUZE/BANDAGES/DRESSINGS) ×3 IMPLANT
DURAPREP 26ML APPLICATOR (WOUND CARE) ×3 IMPLANT
ELECT BLADE 4.0 EZ CLEAN MEGAD (MISCELLANEOUS) ×3
ELECT BLADE 6.5 EXT (BLADE) IMPLANT
ELECT REM PT RETURN 9FT ADLT (ELECTROSURGICAL) ×3
ELECTRODE BLDE 4.0 EZ CLN MEGD (MISCELLANEOUS) ×1 IMPLANT
ELECTRODE REM PT RTRN 9FT ADLT (ELECTROSURGICAL) ×1 IMPLANT
FACESHIELD WRAPAROUND (MASK) ×6 IMPLANT
GLOVE BIOGEL PI IND STRL 8 (GLOVE) ×2 IMPLANT
GLOVE BIOGEL PI INDICATOR 8 (GLOVE) ×4
GLOVE ECLIPSE 8.0 STRL XLNG CF (GLOVE) ×3 IMPLANT
GLOVE ORTHO TXT STRL SZ7.5 (GLOVE) ×6 IMPLANT
GOWN STRL REUS W/ TWL LRG LVL3 (GOWN DISPOSABLE) ×2 IMPLANT
GOWN STRL REUS W/ TWL XL LVL3 (GOWN DISPOSABLE) ×2 IMPLANT
GOWN STRL REUS W/TWL LRG LVL3 (GOWN DISPOSABLE) ×4
GOWN STRL REUS W/TWL XL LVL3 (GOWN DISPOSABLE) ×4
HANDPIECE INTERPULSE COAX TIP (DISPOSABLE) ×2
KIT BASIN OR (CUSTOM PROCEDURE TRAY) ×3 IMPLANT
KIT ROOM TURNOVER OR (KITS) ×3 IMPLANT
MANIFOLD NEPTUNE II (INSTRUMENTS) ×3 IMPLANT
NS IRRIG 1000ML POUR BTL (IV SOLUTION) ×3 IMPLANT
PACK TOTAL JOINT (CUSTOM PROCEDURE TRAY) ×3 IMPLANT
PAD ARMBOARD 7.5X6 YLW CONV (MISCELLANEOUS) ×3 IMPLANT
RTRCTR WOUND ALEXIS 18CM MED (MISCELLANEOUS) ×3
SET HNDPC FAN SPRY TIP SCT (DISPOSABLE) ×1 IMPLANT
STAPLER VISISTAT 35W (STAPLE) IMPLANT
STRIP CLOSURE SKIN 1/2X4 (GAUZE/BANDAGES/DRESSINGS) ×4 IMPLANT
SUT ETHIBOND NAB CT1 #1 30IN (SUTURE) ×3 IMPLANT
SUT MNCRL AB 4-0 PS2 18 (SUTURE) IMPLANT
SUT VIC AB 0 CT1 27 (SUTURE) ×2
SUT VIC AB 0 CT1 27XBRD ANBCTR (SUTURE) ×1 IMPLANT
SUT VIC AB 1 CT1 27 (SUTURE) ×2
SUT VIC AB 1 CT1 27XBRD ANBCTR (SUTURE) ×1 IMPLANT
SUT VIC AB 2-0 CT1 27 (SUTURE) ×2
SUT VIC AB 2-0 CT1 TAPERPNT 27 (SUTURE) ×1 IMPLANT
TOWEL OR 17X24 6PK STRL BLUE (TOWEL DISPOSABLE) ×3 IMPLANT
TOWEL OR 17X26 10 PK STRL BLUE (TOWEL DISPOSABLE) ×3 IMPLANT
TRAY CATH 16FR W/PLASTIC CATH (SET/KITS/TRAYS/PACK) IMPLANT
TRAY FOLEY W/METER SILVER 16FR (SET/KITS/TRAYS/PACK) IMPLANT
WATER STERILE IRR 1000ML POUR (IV SOLUTION) ×6 IMPLANT

## 2017-10-01 NOTE — Transfer of Care (Signed)
Immediate Anesthesia Transfer of Care Note  Patient: Kyle Kidd  Procedure(s) Performed: RIGHT TOTAL HIP ARTHROPLASTY ANTERIOR APPROACH (Right Hip)  Patient Location: PACU  Anesthesia Type:Spinal  Level of Consciousness: awake, alert  and oriented  Airway & Oxygen Therapy: Patient Spontanous Breathing  Post-op Assessment: Report given to RN and Post -op Vital signs reviewed and stable  Post vital signs: Reviewed and stable  Last Vitals:  Vitals:   10/01/17 1317 10/01/17 1721  BP: (!) 178/91 (!) 89/50  Pulse:  (!) 49  Resp:  14  Temp:    SpO2:  100%    Last Pain:  Vitals:   10/01/17 1314  TempSrc: Oral      Patients Stated Pain Goal: 3 (41/03/01 3143)  Complications: No apparent anesthesia complications

## 2017-10-01 NOTE — H&P (Signed)
TOTAL HIP ADMISSION H&P  Patient is admitted for right total hip arthroplasty.  Subjective:  Chief Complaint: right hip pain  HPI: Kyle Kidd, 72 y.o. male, has a history of pain and functional disability in the right hip(s) due to arthritis and patient has failed non-surgical conservative treatments for greater than 12 weeks to include NSAID's and/or analgesics, corticosteriod injections, use of assistive devices, weight reduction as appropriate and activity modification.  Onset of symptoms was gradual starting 2 years ago with gradually worsening course since that time.The patient noted no past surgery on the right hip(s).  Patient currently rates pain in the right hip at 10 out of 10 with activity. Patient has night pain, worsening of pain with activity and weight bearing, trendelenberg gait, pain that interfers with activities of daily living, pain with passive range of motion and crepitus. Patient has evidence of subchondral cysts, subchondral sclerosis, periarticular osteophytes and joint space narrowing by imaging studies. This condition presents safety issues increasing the risk of falls.  There is no current active infection.  Patient Active Problem List   Diagnosis Date Noted  . Unilateral primary osteoarthritis, right hip 09/03/2017  . Pain of right hip joint 09/03/2017  . Occlusion and stenosis of carotid artery without mention of cerebral infarction 01/08/2013   Past Medical History:  Diagnosis Date  . Arthritis   . Bicuspid aortic valve   . Cancer ([place])    skin cancer removed from back 30 yrs ago  . Carotid bruit    right  . Heart murmur    LONG time ago...while in [employer]. No problems or mention of it since.  Marland Kitchen History of kidney stones    last stone 4-5 yrs ago  . Hyperlipidemia   . Hypertension   . Renal calculi    X2   with some insufficiency    Past Surgical History:  Procedure Laterality Date  . EYE SURGERY     lasik on OD  10 yrs ago  . LITHOTRIPSY      x1  . SKIN CANCER EXCISION  2001   back  . TONSILLECTOMY      Prescriptions Prior to Admission  Medication Sig Dispense Refill Last Dose  . acetaminophen (TYLENOL) 500 MG tablet Take 1,000 mg by mouth every 8 (eight) hours as needed for mild pain or headache.   10/01/2017 at 1000  . aspirin 81 MG tablet Take 81 mg by mouth daily.   Past Week at Unknown time  . Capsicum, Cayenne, (CAYENNE PEPPER PO) Take by mouth once.   Past Week at Unknown time  . olmesartan (BENICAR) 5 MG tablet Take 2.5 mg by mouth every other day.    10/01/2017 at 1000   No Known Allergies  Social History  Substance Use Topics  . Smoking status: Never Smoker  . Smokeless tobacco: Never Used  . Alcohol use No    History reviewed. No pertinent family history.   Review of Systems  Musculoskeletal: Positive for joint pain.  All other systems reviewed and are negative.   Objective:  Physical Exam  Constitutional: He is oriented to person, place, and time. He appears well-developed and well-nourished.  HENT:  Head: Normocephalic and atraumatic.  Eyes: Pupils are equal, round, and reactive to light. EOM are normal.  Neck: Normal range of motion. Neck supple.  Cardiovascular: Normal rate and regular rhythm.   Respiratory: Effort normal and breath sounds normal.  GI: Soft. Bowel sounds are normal.  Musculoskeletal:  Right hip: He exhibits decreased range of motion, decreased strength, tenderness and bony tenderness.  Neurological: He is alert and oriented to person, place, and time.  Skin: Skin is warm and dry.  Psychiatric: He has a normal mood and affect.    Vital signs in last 24 hours: Temp:  [97.7 F (36.5 C)] 97.7 F (36.5 C) (10/02 1314) Pulse Rate:  [81] 81 (10/02 1314) Resp:  [20] 20 (10/02 1314) BP: (178)/(91) 178/91 (10/02 1317) SpO2:  [98 %] 98 % (10/02 1314) Weight:  [189 lb (85.7 kg)] 189 lb (85.7 kg) (10/02 1314)  Labs:   Estimated body mass index is 27.12 kg/m as calculated  from the following:   Height as of this encounter: 5\' 10"  (1.778 m).   Weight as of this encounter: 189 lb (85.7 kg).   Imaging Review Plain radiographs demonstrate severe degenerative joint disease of the right hip(s). The bone quality appears to be good for age and reported activity level.  Assessment/Plan:  End stage arthritis, right hip(s)  The patient history, physical examination, clinical judgement of the provider and imaging studies are consistent with end stage degenerative joint disease of the right hip(s) and total hip arthroplasty is deemed medically necessary. The treatment options including medical management, injection therapy, arthroscopy and arthroplasty were discussed at length. The risks and benefits of total hip arthroplasty were presented and reviewed. The risks due to aseptic loosening, infection, stiffness, dislocation/subluxation,  thromboembolic complications and other imponderables were discussed.  The patient acknowledged the explanation, agreed to proceed with the plan and consent was signed. Patient is being admitted for inpatient treatment for surgery, pain control, PT, OT, prophylactic antibiotics, VTE prophylaxis, progressive ambulation and ADL's and discharge planning.The patient is planning to be discharged home with home health services

## 2017-10-01 NOTE — Anesthesia Procedure Notes (Signed)
Spinal  Patient location during procedure: OR Start time: 10/01/2017 3:26 PM Staffing Anesthesiologist: Josephine Igo Performed: anesthesiologist  Preanesthetic Checklist Completed: patient identified, site marked, surgical consent, pre-op evaluation, timeout performed, IV checked, risks and benefits discussed and monitors and equipment checked Spinal Block Patient position: sitting Prep: site prepped and draped and DuraPrep Patient monitoring: heart rate, cardiac monitor, continuous pulse ox and blood pressure Approach: midline Location: L3-4 Injection technique: single-shot Needle Needle type: Pencan  Needle gauge: 24 G Needle length: 9 cm Needle insertion depth: 7 cm Assessment Sensory level: T4 Additional Notes Patient tolerated procedure well. Adequate sensory block.

## 2017-10-01 NOTE — Anesthesia Preprocedure Evaluation (Addendum)
Anesthesia Evaluation  Patient identified by MRN, date of birth, ID band Patient awake    Reviewed: Allergy & Precautions, NPO status , Patient's Chart, lab work & pertinent test results  Airway Mallampati: II  TM Distance: >3 FB Neck ROM: Full    Dental no notable dental hx. (+) Teeth Intact   Pulmonary neg pulmonary ROS,    Pulmonary exam normal breath sounds clear to auscultation       Cardiovascular hypertension, Pt. on medications + Peripheral Vascular Disease  Normal cardiovascular exam+ Valvular Problems/Murmurs  Rhythm:Regular Rate:Normal + Carotid Bruit Right Carotid stenosis   Neuro/Psych negative neurological ROS  negative psych ROS   GI/Hepatic negative GI ROS, Neg liver ROS,   Endo/Other  negative endocrine ROS  Renal/GU Renal InsufficiencyRenal diseaseHx/o renal calculi  negative genitourinary   Musculoskeletal  (+) Arthritis , Osteoarthritis,  OA Right Hip   Abdominal   Peds  Hematology negative hematology ROS (+)   Anesthesia Other Findings   Reproductive/Obstetrics                           Anesthesia Physical Anesthesia Plan  ASA: III  Anesthesia Plan: Spinal   Post-op Pain Management:    Induction:   PONV Risk Score and Plan: 2 and Ondansetron, Dexamethasone and Propofol infusion  Airway Management Planned: Nasal Cannula and Natural Airway  Additional Equipment:   Intra-op Plan:   Post-operative Plan:   Informed Consent: I have reviewed the patients History and Physical, chart, labs and discussed the procedure including the risks, benefits and alternatives for the proposed anesthesia with the patient or authorized representative who has indicated his/her understanding and acceptance.   Dental advisory given  Plan Discussed with: Anesthesiologist, Surgeon and CRNA  Anesthesia Plan Comments:        Anesthesia Quick Evaluation

## 2017-10-01 NOTE — Brief Op Note (Signed)
10/01/2017  4:57 PM  PATIENT:  Larwance Rote  72 y.o. male  PRE-OPERATIVE DIAGNOSIS:  osteoarthritis right hip  POST-OPERATIVE DIAGNOSIS:  osteoarthritis right hip  PROCEDURE:  Procedure(s): RIGHT TOTAL HIP ARTHROPLASTY ANTERIOR APPROACH (Right)  SURGEON:  Surgeon(s) and Role:    Mcarthur Rossetti, MD - Primary  PHYSICIAN ASSISTANT: Benita Stabile, PA-C  ANESTHESIA:  spinal  EBL:  Total I/O In: 1250 [I.V.:1000; IV Piggyback:250] Out: 350 [Blood:350]  COUNTS:  YES  DICTATION: .Other Dictation: Dictation Number [id number]  PLAN OF CARE: Admit to inpatient   PATIENT DISPOSITION:  PACU - hemodynamically stable.   Delay start of Pharmacological VTE agent (>24hrs) due to surgical blood loss or risk of bleeding: no

## 2017-10-02 ENCOUNTER — Encounter (HOSPITAL_COMMUNITY): Payer: Self-pay | Admitting: Orthopaedic Surgery

## 2017-10-02 LAB — CBC
HEMATOCRIT: 35.9 % — AB (ref 39.0–52.0)
Hemoglobin: 11.8 g/dL — ABNORMAL LOW (ref 13.0–17.0)
MCH: 32.6 pg (ref 26.0–34.0)
MCHC: 32.9 g/dL (ref 30.0–36.0)
MCV: 99.2 fL (ref 78.0–100.0)
Platelets: 240 10*3/uL (ref 150–400)
RBC: 3.62 MIL/uL — ABNORMAL LOW (ref 4.22–5.81)
RDW: 14.3 % (ref 11.5–15.5)
WBC: 6.5 10*3/uL (ref 4.0–10.5)

## 2017-10-02 LAB — BASIC METABOLIC PANEL
Anion gap: 8 (ref 5–15)
BUN: 11 mg/dL (ref 6–20)
CALCIUM: 8.5 mg/dL — AB (ref 8.9–10.3)
CHLORIDE: 103 mmol/L (ref 101–111)
CO2: 27 mmol/L (ref 22–32)
CREATININE: 1.52 mg/dL — AB (ref 0.61–1.24)
GFR calc non Af Amer: 44 mL/min — ABNORMAL LOW (ref >60)
GFR, EST AFRICAN AMERICAN: 51 mL/min — AB (ref >60)
GLUCOSE: 137 mg/dL — AB (ref 65–99)
Potassium: 4.8 mmol/L (ref 3.5–5.1)
Sodium: 138 mmol/L (ref 135–145)

## 2017-10-02 NOTE — Anesthesia Postprocedure Evaluation (Signed)
Anesthesia Post Note  Patient: Kyle Kidd  Procedure(s) Performed: RIGHT TOTAL HIP ARTHROPLASTY ANTERIOR APPROACH (Right Hip)     Patient location during evaluation: PACU Anesthesia Type: Spinal Level of consciousness: oriented and awake and alert Pain management: pain level controlled Vital Signs Assessment: post-procedure vital signs reviewed and stable Respiratory status: spontaneous breathing, respiratory function stable and nonlabored ventilation Cardiovascular status: blood pressure returned to baseline and stable Postop Assessment: no headache, no backache, no apparent nausea or vomiting, spinal receding and patient able to bend at knees Anesthetic complications: no    Last Vitals:  Vitals:   10/01/17 1945 10/01/17 2000  BP:  (!) 154/91  Pulse: 71 67  Resp: 17 18  Temp: 36.7 C (!) 36.4 C  SpO2: 98% 95%    Last Pain:  Vitals:   10/02/17 0038  TempSrc:   PainSc: 2                  Ermalinda Joubert A.

## 2017-10-02 NOTE — Evaluation (Signed)
Physical Therapy Evaluation Patient Details Name: Kyle Kidd MRN: [id number] DOB: [birth year] Today's Date: 10/02/2017   History of Present Illness  Pt is a 72 y.o. male now s/p elective R THA (direct anterior approach) on 10/01/17. Pertinent PMH includes HTN, HLD, arthritis, heart murmur.     Clinical Impression  Pt presents with pain and an overall decrease in functional mobility secondary to above. PTA, pt indep and lives at home alone; family plans to live with pt for initial assist upon d/c home. Educ on precautions, positioning, therex, and importance of mobility. Today, pt able to transfer and amb with RW and min guard for balance; very pleasant and motivated to return to PLOF. Will plan for stair training and therex progression this afternoon. Pt would benefit from continued acute PT services to maximize functional mobility and independence prior to d/c with HHPT.       Follow Up Recommendations DC plan and follow up therapy as arranged by surgeon;Home health PT    Equipment Recommendations  None recommended by PT (owns DME)    Recommendations for Other Services OT consult     Precautions / Restrictions Precautions Precautions: None Restrictions Weight Bearing Restrictions: Yes RLE Weight Bearing: Weight bearing as tolerated      Mobility  Bed Mobility Overal bed mobility: Needs Assistance Bed Mobility: Supine to Sit     Supine to sit: Supervision;HOB elevated     General bed mobility comments: No physical assist required  Transfers Overall transfer level: Needs assistance Equipment used: Rolling walker (2 wheeled) Transfers: Sit to/from Stand Sit to Stand: Min guard         General transfer comment: Cues for hand placement and technique with RW; initially dependent on momentum to stand secondary to R hip pain.   Ambulation/Gait Ambulation/Gait assistance: Min guard Ambulation Distance (Feet): 200 Feet Assistive device: Rolling walker (2  wheeled) Gait Pattern/deviations: Step-through pattern;Decreased stride length;Decreased weight shift to right;Antalgic;Trunk flexed Gait velocity: Decreased Gait velocity interpretation: <1.8 ft/sec, indicative of risk for recurrent falls General Gait Details: Slow, controlled gait with decreased weight shift to RLE secondary to increased pain. Cues to decrease WB through BUEs to encourage increased WB through R hip. Cues for upright posture  Stairs            Wheelchair Mobility    Modified Rankin (Stroke Patients Only)       Balance Overall balance assessment: Needs assistance Sitting-balance support: No upper extremity supported;Feet supported Sitting balance-Leahy Scale: Good     Standing balance support: No upper extremity supported;During functional activity;Bilateral upper extremity supported Standing balance-Leahy Scale: Fair Standing balance comment: Able to static stand with no UE support                             Pertinent Vitals/Pain Pain Assessment: Faces Faces Pain Scale: Hurts a little bit Pain Location: R hip Pain Descriptors / Indicators: Discomfort;Grimacing Pain Intervention(s): Monitored during session;Repositioned;Ice applied    Home Living Family/patient expects to be discharged to:: Private residence Living Arrangements: Alone Available Help at Discharge: Family;Available 24 hours/day Type of Home: House Home Access: Stairs to enter Entrance Stairs-Rails: Left (post) Entrance Stairs-Number of Steps: 2 Home Layout: One level Home Equipment: Walker - 2 wheels;Bedside commode;Other (comment) (lift recliner) Additional Comments: Pt's cousin plans to live with him at d/c to help 24/7 if needed initially    Prior Function Level of Independence: Independent  Hand Dominance        Extremity/Trunk Assessment   Upper Extremity Assessment Upper Extremity Assessment: Overall WFL for tasks assessed    Lower  Extremity Assessment Lower Extremity Assessment: RLE deficits/detail RLE Deficits / Details: s/p R THA; hip flexion grossly 3/5       Communication   Communication: No difficulties  Cognition Arousal/Alertness: Awake/alert Behavior During Therapy: WFL for tasks assessed/performed Overall Cognitive Status: Within Functional Limits for tasks assessed                                        General Comments      Exercises Total Joint Exercises Ankle Circles/Pumps: AROM;Both;10 reps;Supine Long Arc Quad: AROM;Right;20 reps;Seated Marching in Standing: AROM;Right;20 reps;Seated   Assessment/Plan    PT Assessment Patient needs continued PT services  PT Problem List Decreased strength;Decreased range of motion;Decreased activity tolerance;Decreased balance;Decreased mobility;Decreased knowledge of use of DME;Pain       PT Treatment Interventions DME instruction;Gait training;Stair training;Functional mobility training;Therapeutic activities;Therapeutic exercise;Balance training;Patient/family education    PT Goals (Current goals can be found in the Care Plan section)  Acute Rehab PT Goals Patient Stated Goal: Return home PT Goal Formulation: With patient Time For Goal Achievement: 10/16/17 Potential to Achieve Goals: Good    Frequency 7X/week   Barriers to discharge        Co-evaluation               AM-PAC PT "6 Clicks" Daily Activity  Outcome Measure Difficulty turning over in bed (including adjusting bedclothes, sheets and blankets)?: A Little Difficulty moving from lying on back to sitting on the side of the bed? : A Little Difficulty sitting down on and standing up from a chair with arms (e.g., wheelchair, bedside commode, etc,.)?: A Little Help needed moving to and from a bed to chair (including a wheelchair)?: A Little Help needed walking in hospital room?: A Little Help needed climbing 3-5 steps with a railing? : A Little 6 Click Score:  18    End of Session Equipment Utilized During Treatment: Gait belt Activity Tolerance: Patient tolerated treatment well Patient left: in chair;with call bell/phone within reach Nurse Communication: Mobility status PT Visit Diagnosis: Other abnormalities of gait and mobility (R26.89);Pain Pain - Right/Left: Right Pain - part of body: Hip    Time: 5456-2563 PT Time Calculation (min) (ACUTE ONLY): 24 min   Charges:   PT Evaluation $PT Eval Low Complexity: 1 Low PT Treatments $Gait Training: 8-22 mins   PT G Codes:       Mabeline Caras, PT, DPT Acute Rehab Services  Pager: [place] 10/02/2017, 8:44 AM

## 2017-10-02 NOTE — Evaluation (Signed)
Occupational Therapy Evaluation Patient Details Name: Kyle Kidd MRN: [id number] DOB: 1945/03/18 Today's Date: 10/02/2017    History of Present Illness Pt is a 72 y.o. male now s/p elective R THA (direct anterior approach) on 10/01/17. Pertinent PMH includes HTN, HLD, arthritis, heart murmur.    Clinical Impression   Pt is currently functioning at a set up to supervision level in ADL and ADL transfers. Educated in compensatory strategies for LB dressing, safe footwear, technique for tub transfer, multiple uses of 3 in 1 and how to transport items safely with RW. No further OT needs.     Follow Up Recommendations  No OT follow up    Equipment Recommendations  None recommended by OT    Recommendations for Other Services       Precautions / Restrictions Precautions Precautions: None Restrictions Weight Bearing Restrictions: Yes RLE Weight Bearing: Weight bearing as tolerated      Mobility Bed Mobility               General bed mobility comments: pt in chair  Transfers Overall transfer level: Needs assistance Equipment used: Rolling walker (2 wheeled) Transfers: Sit to/from Stand Sit to Stand: Supervision         General transfer comment: good technique    Balance Overall balance assessment: Needs assistance   Sitting balance-Leahy Scale: Good       Standing balance-Leahy Scale: Fair                             ADL either performed or assessed with clinical judgement   ADL Overall ADL's : Needs assistance/impaired Eating/Feeding: Independent;Sitting   Grooming: Wash/dry hands;Standing;Supervision/safety   Upper Body Bathing: Set up;Sitting   Lower Body Bathing: Supervison/ safety;Sit to/from stand   Upper Body Dressing : Set up;Sitting   Lower Body Dressing: Supervision/safety;Sit to/from stand   Toilet Transfer: Supervision/safety;RW;Ambulation Armed forces technical officer Details (indicate cue type and reason): pt is aware of use of 3 in  1 over toilet Toileting- Clothing Manipulation and Hygiene: Supervision/safety;Sit to/from Nurse, children's Details (indicate cue type and reason): demonstrated technique, educated pt in use of 3 in 1 as shower seat Functional mobility during ADLs: Supervision/safety;Rolling walker       Vision Baseline Vision/History: Wears glasses Wears Glasses: Reading only Patient Visual Report: No change from baseline       Perception     Praxis      Pertinent Vitals/Pain Pain Assessment: 0-10 Pain Score: 4  Pain Location: R hip Pain Descriptors / Indicators: Sore Pain Intervention(s): Monitored during session;Patient requesting pain meds-RN notified;Ice applied;Repositioned     Hand Dominance Right   Extremity/Trunk Assessment Upper Extremity Assessment Upper Extremity Assessment: Overall WFL for tasks assessed   Lower Extremity Assessment Lower Extremity Assessment: Defer to PT evaluation       Communication Communication Communication: No difficulties   Cognition Arousal/Alertness: Awake/alert Behavior During Therapy: WFL for tasks assessed/performed Overall Cognitive Status: Within Functional Limits for tasks assessed                                     General Comments       Exercises     Shoulder Instructions      Home Living Family/patient expects to be discharged to:: Private residence Living Arrangements: Alone Available Help at Discharge: Family;Available 24 hours/day Type of  Home: House Home Access: Stairs to enter [employer] of Steps: 2 Entrance Stairs-Rails: Left Home Layout: One level     Bathroom Shower/Tub: Teacher, early years/pre: Standard     Home Equipment: Environmental consultant - 2 wheels;Bedside commode;Other (comment) (lift chair)   Additional Comments: Pt's cousin plans to live with him at d/c to help 24/7 if needed initially      Prior Functioning/Environment Level of Independence: Independent                  OT Problem List:        OT Treatment/Interventions:      OT Goals(Current goals can be found in the care plan section) Acute Rehab OT Goals Patient Stated Goal: Return home  OT Frequency:     Barriers to D/C:            Co-evaluation              AM-PAC PT "6 Clicks" Daily Activity     Outcome Measure Help from another person eating meals?: None Help from another person taking care of personal grooming?: None Help from another person toileting, which includes using toliet, bedpan, or urinal?: A Little Help from another person bathing (including washing, rinsing, drying)?: A Little Help from another person to put on and taking off regular upper body clothing?: None Help from another person to put on and taking off regular lower body clothing?: A Little 6 Click Score: 21   End of Session Equipment Utilized During Treatment: Gait belt;Rolling walker Nurse Communication: Patient requests pain meds  Activity Tolerance: Patient tolerated treatment well Patient left: in chair;with call bell/phone within reach;with family/visitor present  OT Visit Diagnosis: Other abnormalities of gait and mobility (R26.89)                Time: 6060-0459 OT Time Calculation (min): 18 min Charges:  OT General Charges $OT Visit: 1 Visit OT Evaluation $OT Eval Low Complexity: 1 Low G-Codes:     Malka So 10/02/2017, 1:32 PM  10/02/2017 Nestor Lewandowsky, OTR/L Pager: [phone]

## 2017-10-02 NOTE — Op Note (Signed)
NAME:  BARAA, TUBBS NO.:  MEDICAL RECORD NO.:  [id number]  LOCATION:                                 FACILITY:  PHYSICIAN:  Lind Guest. Ninfa Linden, M.D.DATE OF BIRTH:  DATE OF PROCEDURE:  10/01/2017 DATE OF DISCHARGE:                              OPERATIVE REPORT   PREOPERATIVE DIAGNOSIS:  Primary osteoarthritis and degenerative joint disease, right hip.  POSTOPERATIVE DIAGNOSIS:  Primary osteoarthritis and degenerative joint disease, right hip.  PROCEDURE:  Right total hip arthroplasty through direct anterior approach.  IMPLANTS:  DePuy Sector Gription acetabular component size 62 with 3 screws, size 36+ 4 polyethylene liner, size 12 Corail femoral component with varus offset, size 36+ 5 ceramic hip ball.  SURGEON:  Lind Guest. Ninfa Linden, M.D.  ASSISTANT:  Erskine Emery, PA-C  ANESTHESIA:  Spinal.  ANTIBIOTICS:  2 g of IV Ancef.  BLOOD LOSS:  250 mL.  COMPLICATIONS:  None.  INDICATIONS:  Mr. Kyle Kidd is a 72 year old gentleman who is very active with debilitating arthritis involving his right hip.  His right hip is significantly diseased with x-ray showing complete loss of the joint space and significant periarticular osteophytes with subluxation of the hip as well as joint space narrowing.  His leg is actually shorter on his right side than the left as well.  At this point, his pain is daily and it is detrimentally affected his activities of daily living, his quality of life and his mobility.  At this point, he does wish to proceed with a total hip arthroplasty through direct anterior approach.  We had a long discussion with him about the risks and benefits of surgery as well as our recommendation.  We talked about the risks of acute blood loss anemia, nerve and vessel injury, fracture, infection, dislocation, DVT.  He understands our goals are to decrease pain, improve mobility and overall improved quality of life.  PROCEDURE  DESCRIPTION:  After informed consent was obtained, appropriate right hip was marked.  He was brought to the operating room where spinal anesthesia was obtained while he was on the stretcher.  He was then laid in a supine position, traction boots were placed on both of his feet. Next, he was placed supine on the Hana fracture table with the perineal post in place and both legs in inline skeletal traction devices, but no traction applied.  His right operative hip was prepped and draped with DuraPrep and sterile drapes.  A time-out was called and he was identified as correct patient and correct right hip.  I then made a standard anterior approach of the hip with an incision just inferior and posterior to the anterior superior iliac spine and carried this obliquely down the leg.  We dissected down the tensor fascia lata muscle.  The tensor fascia was then divided longitudinally to proceed with a direct approach to the hip.  We identified and cauterized the circumflex vessels and then identified the hip capsule.  We opened up the hip capsule in an L-type format, finding a large joint effusion and significant disease in his hip.  We removed the large loose bodies as well.  We placed Cobra retractors on the  medial and lateral femoral neck and then made our femoral neck cut with an oscillating saw and completed this with an osteotome, placed a corkscrew guide in the femoral head and removed the femoral head in its entirety and found it to be completely devoid of cartilage.  We removed more loose debris in the acetabulum. We then cleaned the acetabulum and remnants of the acetabular labrum and other debris.  I placed a bent Hohmann over the medial acetabular rim and then began reaming from a size 42 reamer jumping all the way up to several increments, then finally finishing with the 62 with all reamers under direct visualization and the last reamer under direct fluoroscopy, so we could obtain our  depth of reaming, our inclination and anteversion.  Once we were pleased with this, we placed the real DePuy Sector Gription acetabular component size 62 and I did place three screws as well.  We then placed a 36+ 4 polyethylene liner for that size acetabular component.  Attention was then turned to the femur.  With the leg externally rotated to 120 degrees, extended and adducted, we were able to use a Mueller retractor medially and a Hohmann retractor behind the greater trochanter.  We then released the lateral joint capsule and used a box cutting osteotome to enter the femoral canal and a rongeur to lateralize.  We then began broaching using the Corail broaching system from a size 8 broach going up to the size 12.  With the size 12 in place, we had a nice fill with the canal, we trialed a varus offset femoral neck and a 36+ 1.5 hip ball.  We brought the leg back over and up with traction and internal rotation reducing the pelvis, and we were pleased with stability, but we felt like we needed just a little bit more leg length and offset.  We then dislocated the hip and removed the trial components.  We were able to place the real Corail femoral component with varus offset, size 12 and then real 36+ 5 ceramic hip ball.  We reduced this in the acetabulum.  Again, we were pleased with stability, leg length and offset.  We then irrigated the soft tissue with normal saline solution using pulsatile lavage.  We were able to close the joint capsule with interrupted #1 Ethibond suture followed by running #1 Vicryl in the tensor fascia, 0 Vicryl in the deep tissue, 2-0 Vicryl in the subcutaneous tissue, 4-0 Monocryl subcuticular stitch and Steri-Strips on the skin.  An Aquacel dressing was applied.  He was taken off the Hana table and taken to the recovery room in stable condition.  All final counts were correct.  There were no complications noted.  Of note, Erskine Emery, PA-C, assisted in the  entire case.  His assistance was crucial for facilitating all aspects of this case.     Lind Guest. Ninfa Linden, M.D.     CYB/MEDQ  D:  10/01/2017  T:  10/02/2017  Job:  [id number]

## 2017-10-02 NOTE — Progress Notes (Signed)
Physical Therapy Treatment Patient Details Name: Kyle Kidd MRN: [id number] DOB: 1945-06-26 Today's Date: 10/02/2017    History of Present Illness Pt is a 72 y.o. male now s/p elective R THA (direct anterior approach) on 10/01/17. Pertinent PMH includes HTN, HLD, arthritis, heart murmur.    PT Comments    Pt has progressed very well with mobility, now mod indep for transfers and amb with RW. Ascend/descended steps with L-rail and supervision (friend present throughout session who will be providing initial assist for pt at home upon d/c). Reviewed therex, positioning, and importance of mobility. Encouraged pt to continue amb with RW and supervision during rest of hospital stay (RN aware). All education completed and questions answered; pt has met short-term goals. D/c acute PT.   Follow Up Recommendations  DC plan and follow up therapy as arranged by surgeon;Home health PT     Equipment Recommendations  None recommended by PT (owns DME)    Recommendations for Other Services       Precautions / Restrictions Precautions Precautions: None Restrictions Weight Bearing Restrictions: Yes RLE Weight Bearing: Weight bearing as tolerated    Mobility  Bed Mobility               General bed mobility comments: Received sitting in chair  Transfers Overall transfer level: Modified independent Equipment used: Rolling walker (2 wheeled) Transfers: Sit to/from Stand Sit to Stand: Supervision         General transfer comment: good technique  Ambulation/Gait Ambulation/Gait assistance: Modified independent (Device/Increase time) Ambulation Distance (Feet): 600 Feet Assistive device: Rolling walker (2 wheeled) Gait Pattern/deviations: Step-through pattern;Decreased weight shift to right;Trunk flexed Gait velocity: 1.39 ft/sec Gait velocity interpretation: <1.8 ft/sec, indicative of risk for recurrent falls General Gait Details: Improved gait speed this session, and improved  stride length as pt reports "it's loosening up". Repeated cues for upright posture and to relax shoulders. Overall, good technique with RW   Stairs Stairs: Yes   Stair Management: One rail Left;Step to pattern;Forwards Number of Stairs: 3 General stair comments: Educ on technique with steps; friend present and educ on supervision (she will be who assists pt into house upon d/c)  Wheelchair Mobility    Modified Rankin (Stroke Patients Only)       Balance Overall balance assessment: Needs assistance Sitting-balance support: No upper extremity supported;Feet supported Sitting balance-Leahy Scale: Good     Standing balance support: No upper extremity supported;During functional activity;Bilateral upper extremity supported Standing balance-Leahy Scale: Fair Standing balance comment: Able to static stand with no UE support                            Cognition Arousal/Alertness: Awake/alert Behavior During Therapy: WFL for tasks assessed/performed Overall Cognitive Status: Within Functional Limits for tasks assessed                                        Exercises      General Comments General comments (skin integrity, edema, etc.): Friend present throughout session who will be assisting pt at d/c if needed      Pertinent Vitals/Pain Pain Assessment: Faces Pain Score: 4  Faces Pain Scale: Hurts a little bit Pain Location: R hip Pain Descriptors / Indicators: Sore Pain Intervention(s): Monitored during session    Home Living Family/patient expects to be discharged to:: Private residence Living Arrangements:  Alone Available Help at Discharge: Family;Available 24 hours/day Type of Home: House Home Access: Stairs to enter Entrance Stairs-Rails: Left Home Layout: One level Home Equipment: Walker - 2 wheels;Bedside commode;Other (comment) (lift chair) Additional Comments: Pt's cousin plans to live with him at d/c to help 24/7 if needed  initially    Prior Function Level of Independence: Independent          PT Goals (current goals can now be found in the care plan section) Acute Rehab PT Goals Patient Stated Goal: Return home PT Goal Formulation: With patient Time For Goal Achievement: 10/16/17 Potential to Achieve Goals: Good Progress towards PT goals: Goals met/education completed, patient discharged from PT    Frequency    7X/week      PT Plan Current plan remains appropriate    Co-evaluation              AM-PAC PT "6 Clicks" Daily Activity  Outcome Measure  Difficulty turning over in bed (including adjusting bedclothes, sheets and blankets)?: None Difficulty moving from lying on back to sitting on the side of the bed? : None Difficulty sitting down on and standing up from a chair with arms (e.g., wheelchair, bedside commode, etc,.)?: None Help needed moving to and from a bed to chair (including a wheelchair)?: None Help needed walking in hospital room?: None Help needed climbing 3-5 steps with a railing? : A Little 6 Click Score: 23    End of Session Equipment Utilized During Treatment: Gait belt Activity Tolerance: Patient tolerated treatment well Patient left: in chair;with call bell/phone within reach;with family/visitor present Nurse Communication: Mobility status PT Visit Diagnosis: Other abnormalities of gait and mobility (R26.89);Pain Pain - Right/Left: Right Pain - part of body: Hip     Time: 1191-4782 PT Time Calculation (min) (ACUTE ONLY): 17 min  Charges:  $Gait Training: 8-22 mins                    G Codes:      Mabeline Caras, PT, DPT Acute Rehab Services  Pager: [place] 10/02/2017, 3:08 PM

## 2017-10-02 NOTE — Progress Notes (Signed)
Subjective: 1 Day Post-Op Procedure(s) (LRB): RIGHT TOTAL HIP ARTHROPLASTY ANTERIOR APPROACH (Right) Patient reports pain as moderate.    Objective: Vital signs in last 24 hours: Temp:  [97.5 F (36.4 C)-98.1 F (36.7 C)] 98 F (36.7 C) (10/03 0757) Pulse Rate:  [49-118] 83 (10/03 0757) Resp:  [13-22] 18 (10/03 0757) BP: (81-178)/(49-96) 116/68 (10/03 0757) SpO2:  [91 %-100 %] 95 % (10/03 0757) Weight:  [189 lb (85.7 kg)-194 lb 10.7 oz (88.3 kg)] 194 lb 10.7 oz (88.3 kg) (10/02 2000)  Intake/Output from previous day: 10/02 0701 - 10/03 0700 In: 1750 [I.V.:1500; IV Piggyback:250] Out: 750 [Urine:400; Blood:350] Intake/Output this shift: Total I/O In: 120 [P.O.:120] Out: 300 [Urine:300]   Recent Labs  10/02/17 0501  HGB 11.8*    Recent Labs  10/02/17 0501  WBC 6.5  RBC 3.62*  HCT 35.9*  PLT 240    Recent Labs  10/02/17 0501  NA 138  K 4.8  CL 103  CO2 27  BUN 11  CREATININE 1.52*  GLUCOSE 137*  CALCIUM 8.5*   No results for input(s): LABPT, INR in the last 72 hours.  Sensation intact distally Intact pulses distally Dorsiflexion/Plantar flexion intact Incision: dressing C/D/I  Assessment/Plan: 1 Day Post-Op Procedure(s) (LRB): RIGHT TOTAL HIP ARTHROPLASTY ANTERIOR APPROACH (Right) Up with therapy  Mcarthur Rossetti 10/02/2017, 8:06 AM

## 2017-10-03 MED ORDER — ASPIRIN 81 MG PO CHEW: 81.0000 mg | CHEWABLE_TABLET | Freq: Two times a day (BID) | ORAL | 0 refills | Status: DC

## 2017-10-03 MED ORDER — TIZANIDINE HCL 4 MG PO TABS: 4.0000 mg | ORAL_TABLET | Freq: Three times a day (TID) | ORAL | 0 refills | Status: DC | PRN

## 2017-10-03 MED ORDER — OXYCODONE-ACETAMINOPHEN 5-325 MG PO TABS: 1.0000 | ORAL_TABLET | ORAL | 0 refills | Status: DC | PRN

## 2017-10-03 NOTE — Discharge Instructions (Signed)

## 2017-10-03 NOTE — Progress Notes (Signed)
Patient ID: Kyle Kidd, male   DOB: [birth year], 72 y.o.   MRN: [id number] Doing well overall.  Can be discharged to home this afternoon.  Right hip stable.  Vitals stable.

## 2017-10-03 NOTE — Care Management Note (Signed)
Case Management Note  Patient Details  Name: Kyle Kidd MRN: [id number] Date of Birth: 1945-09-10  Subjective/Objective:     72 yr old gentleman s/p right total hip arthroplasty, anterior approach.               Action/Plan: Case manager spoke with patient concerning discharge and DME. Patient was preoperatively setup with [facility], no changes,. He has RW and 3in1 at home, patient says his cousin will be staying with him for a few days.    Expected Discharge Date:  10/03/17               Expected Discharge Plan:  [place]  In-House Referral:  NA  Discharge planning Services  CM Consult  Post Acute Care Choice:  Home Health Choice offered to:  Patient  DME Arranged:   (Has RW and 3in1) DME Agency:  NA  HH Arranged:  PT [place] Agency:  [facility] (formerly [employer])  Status of Service:  Completed, signed off  If discussed at [employer], dates discussed:    Additional Comments:  Ninfa Meeker, RN 10/03/2017, 10:53 AM

## 2017-10-03 NOTE — Discharge Summary (Signed)
Patient ID: Kyle Kidd MRN: [id number] DOB/AGE: [birth year] 72 y.o.  Admit date: 10/01/2017 Discharge date: 10/03/2017  Admission Diagnoses:  Principal Problem:   Unilateral primary osteoarthritis, right hip Active Problems:   Status post total replacement of right hip   Discharge Diagnoses:  Same  Past Medical History:  Diagnosis Date  . Arthritis   . Bicuspid aortic valve   . Cancer ([place])    skin cancer removed from back 30 yrs ago  . Carotid bruit    right  . Heart murmur    LONG time ago...while in [employer]. No problems or mention of it since.  Kyle Kidd History of kidney stones    last stone 4-5 yrs ago  . Hyperlipidemia   . Hypertension   . Renal calculi    X2   with some insufficiency    Surgeries: Procedure(s): RIGHT TOTAL HIP ARTHROPLASTY ANTERIOR APPROACH on 10/01/2017   Consultants:   Discharged Condition: Improved  Hospital Course: Kyle Kidd is an 72 y.o. male who was admitted 10/01/2017 for operative treatment ofUnilateral primary osteoarthritis, right hip. Patient has severe unremitting pain that affects sleep, daily activities, and work/hobbies. After pre-op clearance the patient was taken to the operating room on 10/01/2017 and underwent  Procedure(s): RIGHT TOTAL HIP ARTHROPLASTY ANTERIOR APPROACH.    Patient was given perioperative antibiotics: Anti-infectives    Start     Dose/Rate Route Frequency Ordered Stop   10/01/17 2015  ceFAZolin (ANCEF) IVPB 1 g/50 mL premix     1 g 100 mL/hr over 30 Minutes Intravenous Every 6 hours 10/01/17 2007 10/02/17 0258   10/01/17 1430  ceFAZolin (ANCEF) IVPB 2g/100 mL premix     2 g 200 mL/hr over 30 Minutes Intravenous To [facility] 09/30/17 1329 10/01/17 1528       Patient was given sequential compression devices, early ambulation, and chemoprophylaxis to prevent DVT.  Patient benefited maximally from hospital stay and there were no complications.    Recent vital signs: Patient Vitals for  the past 24 hrs:  BP Temp Temp src Pulse Resp SpO2  10/03/17 0650 126/68 98.6 F (37 C) Oral 68 18 96 %  10/02/17 2108 115/60 98.8 F (37.1 C) Oral 70 18 96 %  10/02/17 1422 (!) 112/53 98.7 F (37.1 C) Oral 71 17 95 %     Recent laboratory studies:  Recent Labs  10/02/17 0501  WBC 6.5  HGB 11.8*  HCT 35.9*  PLT 240  NA 138  K 4.8  CL 103  CO2 27  BUN 11  CREATININE 1.52*  GLUCOSE 137*  CALCIUM 8.5*     Discharge Medications:   Allergies as of 10/03/2017   No Known Allergies     Medication List    STOP taking these medications   aspirin 81 MG tablet Replaced by:  aspirin 81 MG chewable tablet     TAKE these medications   acetaminophen 500 MG tablet Commonly known as:  TYLENOL Take 1,000 mg by mouth every 8 (eight) hours as needed for mild pain or headache.   aspirin 81 MG chewable tablet Chew 1 tablet (81 mg total) by mouth 2 (two) times daily. Replaces:  aspirin 81 MG tablet   CAYENNE PEPPER PO Take by mouth once.   olmesartan 5 MG tablet Commonly known as:  BENICAR Take 2.5 mg by mouth every other day.   oxyCODONE-acetaminophen 5-325 MG tablet Commonly known as:  ROXICET Take 1-2 tablets by mouth every 4 (four) hours as needed.  tiZANidine 4 MG tablet Commonly known as:  ZANAFLEX Take 1 tablet (4 mg total) by mouth every 8 (eight) hours as needed for muscle spasms.            Durable Medical Equipment        Start     Ordered   10/01/17 2008  DME Walker rolling  Once    Question:  Patient needs a walker to treat with the following condition  Answer:  Status post total replacement of right hip   10/01/17 2007   10/01/17 2008  DME 3 n 1  Once     10/01/17 2007      Diagnostic Studies: Dg Pelvis Portable  Result Date: 10/01/2017 CLINICAL DATA:  Post right total hip replacement. EXAM: PORTABLE PELVIS 1-2 VIEWS COMPARISON:  None. FINDINGS: Exam demonstrates evidence of patient's recent right total hip arthroplasty with prosthetic  components intact and normally located. Mild degenerative change of the left hip. Postsurgical change over the right hip soft tissues. IMPRESSION: Evidence of patient's recent right total hip arthroplasty intact without complicating features. Electronically Signed   By: Marin Olp M.D.   On: 10/01/2017 19:58   Dg C-arm 61-120 Min  Result Date: 10/01/2017 CLINICAL DATA:  Right hip replacement EXAM: OPERATIVE right HIP (WITH PELVIS IF PERFORMED) 3 VIEWS TECHNIQUE: Fluoroscopic spot image(s) were submitted for interpretation post-operatively. COMPARISON:  None. FINDINGS: Total fluoroscopy time was 48 seconds. 3 low resolution intraoperative spot views of the right hip. The images demonstrate a right hip replacement with normal alignment and intact hardware. IMPRESSION: Intraoperative fluoroscopic assistance provided during right hip replacement Electronically Signed   By: Donavan Foil M.D.   On: 10/01/2017 18:54   Dg Hip Operative Unilat W Or W/o Pelvis Right  Result Date: 10/01/2017 CLINICAL DATA:  Right hip replacement EXAM: OPERATIVE right HIP (WITH PELVIS IF PERFORMED) 3 VIEWS TECHNIQUE: Fluoroscopic spot image(s) were submitted for interpretation post-operatively. COMPARISON:  None. FINDINGS: Total fluoroscopy time was 48 seconds. 3 low resolution intraoperative spot views of the right hip. The images demonstrate a right hip replacement with normal alignment and intact hardware. IMPRESSION: Intraoperative fluoroscopic assistance provided during right hip replacement Electronically Signed   By: Donavan Foil M.D.   On: 10/01/2017 18:54    Disposition: to home  Discharge Instructions    Discharge patient    Complete by:  As directed    Discharge disposition:  01-Home or Self Care   Discharge patient date:  10/03/2017      Follow-up Information    Kyle Rossetti, MD Follow up in 2 week(s).   Specialty:  Orthopedic Surgery Contact information: [address]  [place] [address] [phone]            Signed: Mcarthur Kidd 10/03/2017, 9:42 AM

## 2017-10-03 NOTE — Progress Notes (Signed)
Pt discharge instructions reviewed with pt. Pt verbalizes understanding. Pt belongings with pt. Pt is not in distress. Pt discharged via wheelchair. Pt's girlfriend is driving him home.

## 2017-10-04 ENCOUNTER — Telehealth (INDEPENDENT_AMBULATORY_CARE_PROVIDER_SITE_OTHER): Payer: Self-pay

## 2017-10-04 DIAGNOSIS — Z471 Aftercare following joint replacement surgery: Secondary | ICD-10-CM | POA: Diagnosis not present

## 2017-10-04 DIAGNOSIS — Z96641 Presence of right artificial hip joint: Secondary | ICD-10-CM | POA: Diagnosis not present

## 2017-10-04 DIAGNOSIS — I10 Essential (primary) hypertension: Secondary | ICD-10-CM | POA: Diagnosis not present

## 2017-10-04 NOTE — Telephone Encounter (Signed)
IC verbal given. Please call if something different should be done.

## 2017-10-04 NOTE — Telephone Encounter (Signed)
Ana with [facility] called requesting verbal orders for patient for 1wk 1, 3x/wk for 1 wk and 1x/wk for 1wk then d/c patient. Please call her to advise. [phone]

## 2017-10-07 DIAGNOSIS — Z471 Aftercare following joint replacement surgery: Secondary | ICD-10-CM | POA: Diagnosis not present

## 2017-10-07 DIAGNOSIS — I10 Essential (primary) hypertension: Secondary | ICD-10-CM | POA: Diagnosis not present

## 2017-10-07 DIAGNOSIS — Z96641 Presence of right artificial hip joint: Secondary | ICD-10-CM | POA: Diagnosis not present

## 2017-10-09 DIAGNOSIS — I10 Essential (primary) hypertension: Secondary | ICD-10-CM | POA: Diagnosis not present

## 2017-10-09 DIAGNOSIS — Z471 Aftercare following joint replacement surgery: Secondary | ICD-10-CM | POA: Diagnosis not present

## 2017-10-09 DIAGNOSIS — Z96641 Presence of right artificial hip joint: Secondary | ICD-10-CM | POA: Diagnosis not present

## 2017-10-11 DIAGNOSIS — I10 Essential (primary) hypertension: Secondary | ICD-10-CM | POA: Diagnosis not present

## 2017-10-11 DIAGNOSIS — Z471 Aftercare following joint replacement surgery: Secondary | ICD-10-CM | POA: Diagnosis not present

## 2017-10-11 DIAGNOSIS — Z96641 Presence of right artificial hip joint: Secondary | ICD-10-CM | POA: Diagnosis not present

## 2017-10-15 ENCOUNTER — Ambulatory Visit (INDEPENDENT_AMBULATORY_CARE_PROVIDER_SITE_OTHER): Payer: Medicare HMO | Admitting: Physician Assistant

## 2017-10-15 DIAGNOSIS — Z96641 Presence of right artificial hip joint: Secondary | ICD-10-CM

## 2017-10-15 DIAGNOSIS — I10 Essential (primary) hypertension: Secondary | ICD-10-CM | POA: Diagnosis not present

## 2017-10-15 DIAGNOSIS — Z471 Aftercare following joint replacement surgery: Secondary | ICD-10-CM | POA: Diagnosis not present

## 2017-10-15 NOTE — Progress Notes (Signed)
Kyle Kidd returns today 2 weeks status post right total hip arthroplasty. He is overall doing very well. He has no complaints. He is taking just Tylenol for pain. Denies shortness breath fevers chills or chest pai  Right hip: Good range of motion of the hip without pain. Surgical incisions healing well no signs of infection. Positive seroma. Right calf supple nontender. Dorsiflexion plantar flexion intact right foot.  Impression 14 days status post right total hip arthroplasty  Plan: He'll continue work on range of motion strengthening. Right hip was prepped with Betadine today at chloride used to incise skin and then 110 mL of a seroma was aspirated patient tolerated well. We'll see him back in 1 month sooner if there is any questions or concerns.

## 2017-11-16 IMAGING — RF DG HIP (WITH PELVIS) OPERATIVE*R*
1 series · 3 of 3 positions shown · non-contrast
Comparison: None.

CLINICAL DATA: Right hip replacement

EXAM:
OPERATIVE right HIP (WITH PELVIS IF PERFORMED) 3 VIEWS
TECHNIQUE: Fluoroscopic spot image(s) were submitted for interpretation
post-operatively.

[Series 1: run · 3 of 3 slices shown]
[im 1/3]
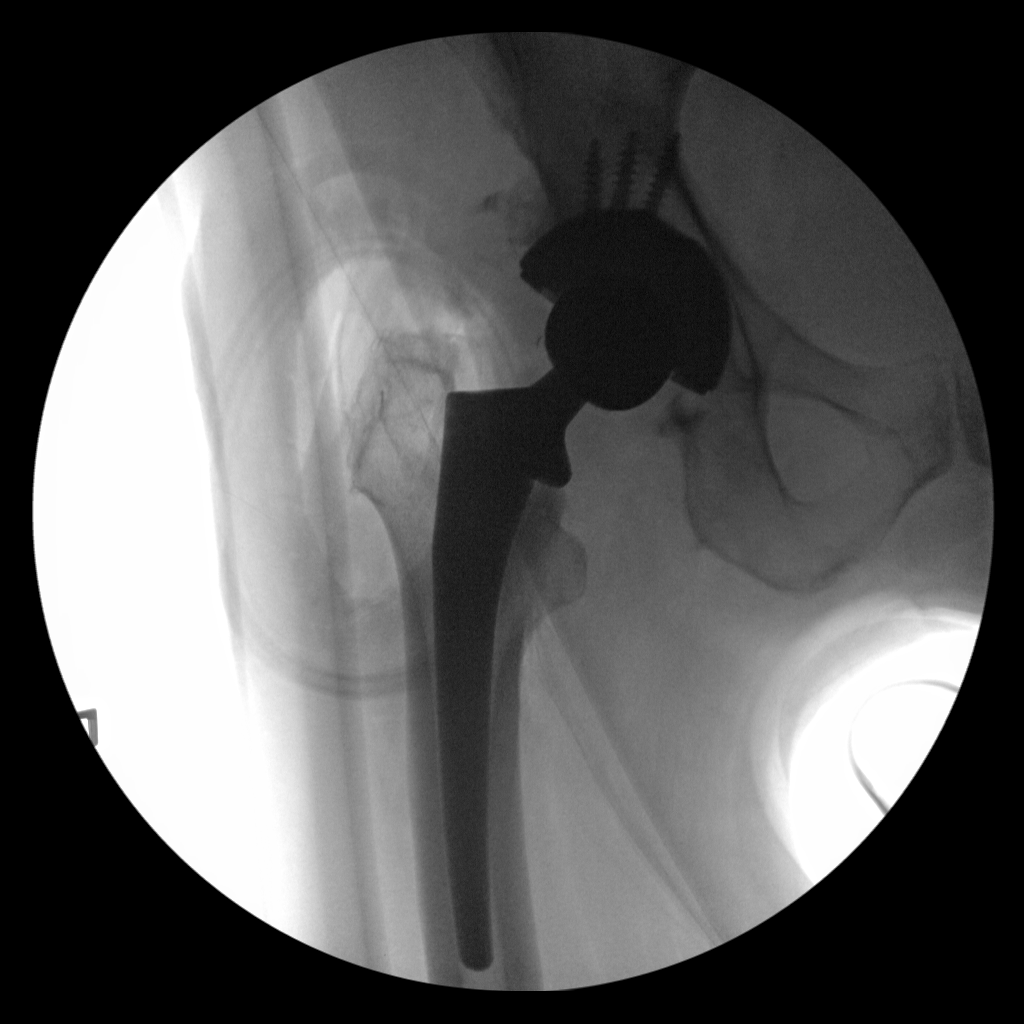
[im 2/3]
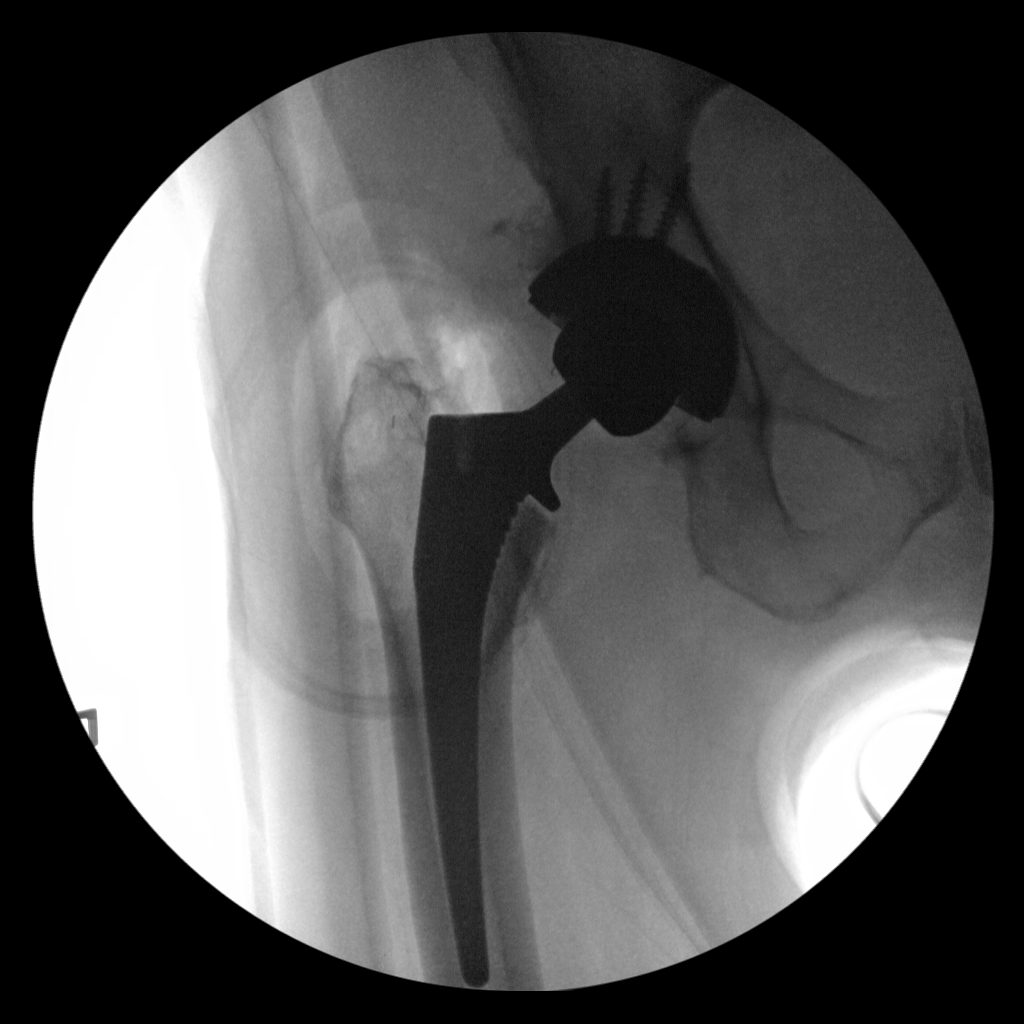
[im 3/3]
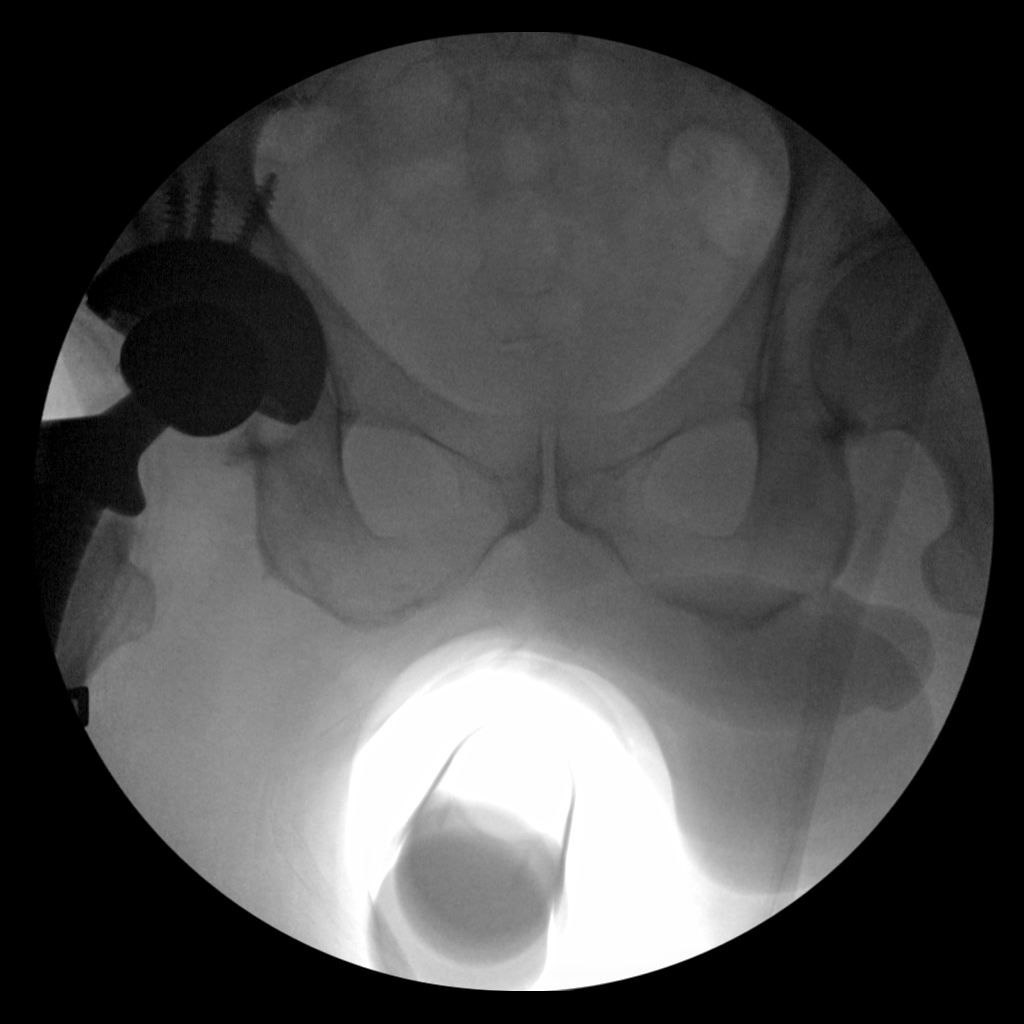

[3 of 3 positions shown; findings below may reference images not displayed]

FINDINGS: Total fluoroscopy time was 48 seconds. 3 low resolution
intraoperative spot views of the right hip. The images demonstrate a
right hip replacement with normal alignment and intact hardware.
IMPRESSION: Intraoperative fluoroscopic assistance provided during right hip
replacement

## 2017-11-18 ENCOUNTER — Encounter (INDEPENDENT_AMBULATORY_CARE_PROVIDER_SITE_OTHER): Payer: Self-pay | Admitting: Orthopaedic Surgery

## 2017-11-18 ENCOUNTER — Ambulatory Visit (INDEPENDENT_AMBULATORY_CARE_PROVIDER_SITE_OTHER): Payer: Medicare HMO | Admitting: Orthopaedic Surgery

## 2017-11-18 DIAGNOSIS — Z96641 Presence of right artificial hip joint: Secondary | ICD-10-CM

## 2017-11-18 DIAGNOSIS — M25552 Pain in left hip: Secondary | ICD-10-CM

## 2017-11-18 NOTE — Progress Notes (Addendum)
Kyle Kidd returns today for follow-up of his right total hip arthroplasty states he is doing well.  He is now 45 days status post right total hip arthroplasty.  His right hip is doing very well.  He has no complaints.  He denies any fevers, chills, chest pain, nausea or vomiting.  He is having some left hip pain he points to proximal anterior aspect of the left hip.  States is much like what he had right hip.  Currently however the left hip is causing a lot of pain.  He is back at work.  Physical exam right hip excellent range of motion without pain.  Surgical incisions healed well no signs of infection.  No signs of seroma.  Left hip he has good range of motion without pain.  No tenderness over trochanteric region.  Radiographs: AP pelvis from 10-01-17 months showed the left hip to be overall well preserved.  He has some spurring off of the left trochanter.  Impression: Status post right total hip arthroplasty 10-01-17  Left hip pain  Plan: We will see him back in 6 months post.  Definitely see him back sooner if he has any questions or concerns.  He may benefit from intra-articular injection of the left hip in the future.  He can always call our office and set this up.  Questions are encouraged and answered by Dr. Ninfa Linden and myself.   I was able to see the patient myself as well as examined him and answered all his questions.  I agree with the above note.

## 2018-05-19 ENCOUNTER — Ambulatory Visit (INDEPENDENT_AMBULATORY_CARE_PROVIDER_SITE_OTHER): Payer: Medicare HMO | Admitting: Orthopaedic Surgery

## 2018-05-19 ENCOUNTER — Ambulatory Visit (INDEPENDENT_AMBULATORY_CARE_PROVIDER_SITE_OTHER): Payer: Medicare HMO

## 2018-05-19 ENCOUNTER — Encounter (INDEPENDENT_AMBULATORY_CARE_PROVIDER_SITE_OTHER): Payer: Self-pay | Admitting: Orthopaedic Surgery

## 2018-05-19 DIAGNOSIS — Z96641 Presence of right artificial hip joint: Secondary | ICD-10-CM

## 2018-05-19 NOTE — Progress Notes (Signed)
The patient is 8 months status post a right total hip arthroplasty direct anterior approach.  He is doing well otherwise.  He said he actually ran the other day just to get out of the rain.  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  On exam I can easily move his hip around without any issues at all.  His leg lengths are equal.  He walks without any limp and walks normally gets up in the chair easily.  At this point he is doing so well he can follow-up as needed.  X-rays confirm a well-seated implant with no complicating features.  All questions concerns were answered and addressed.  If he has any issues at all he will let us know.

## 2018-06-12 DIAGNOSIS — H25813 Combined forms of age-related cataract, bilateral: Secondary | ICD-10-CM | POA: Diagnosis not present

## 2018-07-29 DIAGNOSIS — E785 Hyperlipidemia, unspecified: Secondary | ICD-10-CM | POA: Diagnosis not present

## 2018-07-29 DIAGNOSIS — M791 Myalgia, unspecified site: Secondary | ICD-10-CM | POA: Diagnosis not present

## 2018-07-29 DIAGNOSIS — I129 Hypertensive chronic kidney disease with stage 1 through stage 4 chronic kidney disease, or unspecified chronic kidney disease: Secondary | ICD-10-CM | POA: Diagnosis not present

## 2018-07-29 DIAGNOSIS — I6529 Occlusion and stenosis of unspecified carotid artery: Secondary | ICD-10-CM | POA: Diagnosis not present

## 2018-07-29 DIAGNOSIS — N183 Chronic kidney disease, stage 3 (moderate): Secondary | ICD-10-CM | POA: Diagnosis not present

## 2018-08-04 DIAGNOSIS — N183 Chronic kidney disease, stage 3 (moderate): Secondary | ICD-10-CM | POA: Diagnosis not present

## 2018-08-04 DIAGNOSIS — Z6828 Body mass index (BMI) 28.0-28.9, adult: Secondary | ICD-10-CM | POA: Diagnosis not present

## 2018-08-04 DIAGNOSIS — E162 Hypoglycemia, unspecified: Secondary | ICD-10-CM | POA: Diagnosis not present

## 2018-12-01 DIAGNOSIS — I359 Nonrheumatic aortic valve disorder, unspecified: Secondary | ICD-10-CM | POA: Diagnosis not present

## 2018-12-01 DIAGNOSIS — I208 Other forms of angina pectoris: Secondary | ICD-10-CM | POA: Diagnosis not present

## 2018-12-01 DIAGNOSIS — I2510 Atherosclerotic heart disease of native coronary artery without angina pectoris: Secondary | ICD-10-CM | POA: Diagnosis not present

## 2018-12-01 DIAGNOSIS — R0989 Other specified symptoms and signs involving the circulatory and respiratory systems: Secondary | ICD-10-CM | POA: Diagnosis not present

## 2018-12-01 DIAGNOSIS — I679 Cerebrovascular disease, unspecified: Secondary | ICD-10-CM | POA: Diagnosis not present

## 2018-12-01 DIAGNOSIS — R42 Dizziness and giddiness: Secondary | ICD-10-CM | POA: Diagnosis not present

## 2018-12-01 DIAGNOSIS — R011 Cardiac murmur, unspecified: Secondary | ICD-10-CM | POA: Diagnosis not present

## 2018-12-01 DIAGNOSIS — R9431 Abnormal electrocardiogram [ECG] [EKG]: Secondary | ICD-10-CM | POA: Diagnosis not present

## 2018-12-17 ENCOUNTER — Other Ambulatory Visit: Payer: Self-pay

## 2018-12-17 DIAGNOSIS — I6523 Occlusion and stenosis of bilateral carotid arteries: Secondary | ICD-10-CM

## 2019-01-27 ENCOUNTER — Ambulatory Visit (HOSPITAL_COMMUNITY)
Admission: RE | Admit: 2019-01-27 | Discharge: 2019-01-27 | Disposition: A | Discharge status: Home / Self Care | Payer: Medicare HMO | Source: Ambulatory Visit | Attending: Vascular Surgery | Admitting: Vascular Surgery

## 2019-01-27 ENCOUNTER — Ambulatory Visit (INDEPENDENT_AMBULATORY_CARE_PROVIDER_SITE_OTHER): Payer: Medicare HMO | Admitting: Vascular Surgery

## 2019-01-27 ENCOUNTER — Other Ambulatory Visit: Payer: Self-pay

## 2019-01-27 ENCOUNTER — Encounter: Payer: Self-pay | Admitting: Vascular Surgery

## 2019-01-27 VITALS — BP 153/95 | HR 75 | Temp 97.3°F | Resp 16 | Ht 70.0 in | Wt 200.0 lb

## 2019-01-27 DIAGNOSIS — I6523 Occlusion and stenosis of bilateral carotid arteries: Secondary | ICD-10-CM

## 2019-01-27 NOTE — Progress Notes (Signed)
[facility] [place]  Patient name: Kyle Kidd MRN: [id number] DOB: [birth year] Sex: male  REASON FOR CONSULT: Follow-up right carotid stenosis  HPI: Kyle Kidd is a 74 y.o. male, who is here today for follow-up of right carotid stenosis.  He has very unusual history.  I had seen him back in 2014.  At that time he had had an evaluation for carotid bruit at [facility].  He had a MRA and carotid duplex which both suggested severe right internal carotid artery stenosis.  I had discussed this with the patient at that time and had recommended right carotid endarterectomy for reduction of stroke risk.  The patient decided against surgery and was lost to follow-up regarding this.  He recently had hip surgery just over a year ago.  At that time his anesthesiologist suggested that he revisit the issue regarding his asymptomatic carotid disease.  He now presents for further discussion.  He has had no prior episodes of amaurosis fugax, a aphasia or TIA or stroke.  Does have known cardiac issues.  Also has mild renal insufficiency.  History of kidney stones.  Past Medical History:  Diagnosis Date  . Arthritis   . Bicuspid aortic valve   . Cancer ([place])    skin cancer removed from back 30 yrs ago  . Carotid bruit    right  . Heart murmur    LONG time ago...while in [employer]. No problems or mention of it since.  Marland Kitchen History of kidney stones    last stone 4-5 yrs ago  . Hyperlipidemia   . Hypertension   . Renal calculi    X2   with some insufficiency    History reviewed. No pertinent family history.  SOCIAL HISTORY: Social History   Socioeconomic History  . Marital status: Widowed    Spouse name: Not on file  . Number of children: Not on file  . Years of education: Not on file  . Highest education level: Not on file  Occupational History  . Not on file  Social Needs  . Financial resource strain: Not on file  .  Food insecurity:    Worry: Not on file    Inability: Not on file  . Transportation needs:    Medical: Not on file    Non-medical: Not on file  Tobacco Use  . Smoking status: Never Smoker  . Smokeless tobacco: Never Used  Substance and Sexual Activity  . Alcohol use: No  . Drug use: No  . Sexual activity: Not on file  Lifestyle  . Physical activity:    Days per week: Not on file    Minutes per session: Not on file  . Stress: Not on file  Relationships  . Social connections:    Talks on phone: Not on file    Gets together: Not on file    Attends religious service: Not on file    Active member of club or organization: Not on file    Attends meetings of clubs or organizations: Not on file    Relationship status: Not on file  . Intimate partner violence:    Fear of current or ex partner: Not on file    Emotionally abused: Not on file    Physically abused: Not on file    Forced sexual activity: Not on file  Other Topics Concern  . Not on file  Social History Narrative  . Not on file    Allergies  Allergen  Reactions  . Tizanidine Shortness Of Breath    Zanaflex.  Swollen tongue.     Current Outpatient Medications  Medication Sig Dispense Refill  . acetaminophen (TYLENOL) 500 MG tablet Take 1,000 mg by mouth every 8 (eight) hours as needed for mild pain or headache.    Marland Kitchen aspirin 81 MG chewable tablet Chew 1 tablet (81 mg total) by mouth 2 (two) times daily. 60 tablet 0  . Capsicum, Cayenne, (CAYENNE PEPPER PO) Take by mouth once.     No current facility-administered medications for this visit.     REVIEW OF SYSTEMS:  [X]  denotes positive finding, [ ]  denotes negative finding Cardiac  Comments:  Chest pain or chest pressure:    Shortness of breath upon exertion:    Short of breath when lying flat:    Irregular heart rhythm:        Vascular    Pain in calf, thigh, or hip brought on by ambulation:    Pain in feet at night that wakes you up from your sleep:       Blood clot in your veins:    Leg swelling:         Pulmonary    Oxygen at home:    Productive cough:     Wheezing:         Neurologic    Sudden weakness in arms or legs:     Sudden numbness in arms or legs:     Sudden onset of difficulty speaking or slurred speech:    Temporary loss of vision in one eye:     Problems with dizziness:         Gastrointestinal    Blood in stool:     Vomited blood:         Genitourinary    Burning when urinating:     Blood in urine:        Psychiatric    Major depression:         Hematologic    Bleeding problems:    Problems with blood clotting too easily:        Skin    Rashes or ulcers:        Constitutional    Fever or chills:      PHYSICAL EXAM: Vitals:   01/27/19 1420 01/27/19 1423  BP: (!) 153/90 (!) 153/95  Pulse: 75   Resp: 16   Temp: (!) 97.3 F (36.3 C)   TempSrc: Oral   SpO2: 96%   Weight: 200 lb (90.7 kg)   Height: 5\' 10"  (1.778 m)     GENERAL: The patient is a well-nourished male, in no acute distress. The vital signs are documented above. CARDIOVASCULAR: Does have a right carotid bruit and no left carotid bruit.  2+ radial pulses and 2+ dorsalis pedis pulses bilaterally. PULMONARY: There is good air exchange  ABDOMEN: Soft and non-tender  MUSCULOSKELETAL: There are no major deformities or cyanosis. NEUROLOGIC: No focal weakness or paresthesias are detected. SKIN: There are no ulcers or rashes noted. PSYCHIATRIC: The patient has a normal affect.  DATA:  Carotid duplex today was reviewed with the patient.  This shows calcification at the right carotid bifurcation.  He does have severe stenosis in his right external carotid but no significant stenosis visualized in his internal carotid.  It is possible that this is obscured with calcification.  Calcification but no severe stenosis in his left carotid  MEDICAL ISSUES: I discussed the significance of this with the patient.  I would recommend CT angiogram to  completely rule out any significant disease in his right carotid artery.  With his baseline renal insufficiency I feel that the risk for iodinated contrast is to severe for this.  It is possible that he had over estimation of his stenosis in 2014 with both MRA and duplex.  It is also possible that he has significant stenosis in his internal carotid that is masked by the calcified plaque.  I did explain to the patient that if he has a high-grade stenosis which is asymptomatic this would put him at approximately 5 %/year risk of transient ischemic attack or stroke.  He reports that he is willing to continue this level of risk even if he does have a tight stenosis.  He has a friend present who has been with transportation and also I had started him on garlic and other herbal medicines at the time of his discussion of carotid disease in 2014.  She strongly that this may have reduced his level of stenosis.  Regardless, he wishes to continue observation only and will notify us or present immediately to the hospital should he develop any neurologic deficit.   Rosetta Posner, MD FACS [facility] Tel [phone] Pager [phone]

## 2019-07-06 DIAGNOSIS — H25813 Combined forms of age-related cataract, bilateral: Secondary | ICD-10-CM | POA: Diagnosis not present

## 2019-09-22 DIAGNOSIS — E559 Vitamin D deficiency, unspecified: Secondary | ICD-10-CM | POA: Diagnosis not present

## 2019-09-22 DIAGNOSIS — E785 Hyperlipidemia, unspecified: Secondary | ICD-10-CM | POA: Diagnosis not present

## 2019-09-22 DIAGNOSIS — N183 Chronic kidney disease, stage 3 (moderate): Secondary | ICD-10-CM | POA: Diagnosis not present

## 2019-09-22 DIAGNOSIS — D649 Anemia, unspecified: Secondary | ICD-10-CM | POA: Diagnosis not present

## 2019-09-22 DIAGNOSIS — I129 Hypertensive chronic kidney disease with stage 1 through stage 4 chronic kidney disease, or unspecified chronic kidney disease: Secondary | ICD-10-CM | POA: Diagnosis not present

## 2020-01-25 DIAGNOSIS — R0602 Shortness of breath: Secondary | ICD-10-CM | POA: Diagnosis not present

## 2020-01-25 DIAGNOSIS — J1282 Pneumonia due to coronavirus disease 2019: Secondary | ICD-10-CM | POA: Diagnosis not present

## 2020-01-25 DIAGNOSIS — I4891 Unspecified atrial fibrillation: Secondary | ICD-10-CM | POA: Diagnosis not present

## 2020-01-25 DIAGNOSIS — U071 COVID-19: Secondary | ICD-10-CM | POA: Diagnosis not present

## 2020-01-26 DIAGNOSIS — R0602 Shortness of breath: Secondary | ICD-10-CM | POA: Diagnosis not present

## 2020-02-08 DIAGNOSIS — I10 Essential (primary) hypertension: Secondary | ICD-10-CM | POA: Diagnosis not present

## 2020-02-08 DIAGNOSIS — I4891 Unspecified atrial fibrillation: Secondary | ICD-10-CM | POA: Diagnosis not present

## 2020-02-10 DIAGNOSIS — I4891 Unspecified atrial fibrillation: Secondary | ICD-10-CM | POA: Diagnosis not present

## 2020-05-09 DIAGNOSIS — I4891 Unspecified atrial fibrillation: Secondary | ICD-10-CM | POA: Diagnosis not present

## 2020-07-11 DIAGNOSIS — H25813 Combined forms of age-related cataract, bilateral: Secondary | ICD-10-CM | POA: Diagnosis not present

## 2021-10-16 DIAGNOSIS — H25813 Combined forms of age-related cataract, bilateral: Secondary | ICD-10-CM | POA: Diagnosis not present

## 2021-10-31 DIAGNOSIS — I10 Essential (primary) hypertension: Secondary | ICD-10-CM | POA: Diagnosis not present

## 2021-10-31 DIAGNOSIS — I480 Paroxysmal atrial fibrillation: Secondary | ICD-10-CM | POA: Diagnosis not present

## 2021-10-31 DIAGNOSIS — E785 Hyperlipidemia, unspecified: Secondary | ICD-10-CM | POA: Diagnosis not present

## 2021-10-31 DIAGNOSIS — I6521 Occlusion and stenosis of right carotid artery: Secondary | ICD-10-CM | POA: Diagnosis not present

## 2021-11-07 DIAGNOSIS — I6521 Occlusion and stenosis of right carotid artery: Secondary | ICD-10-CM | POA: Diagnosis not present

## 2021-11-07 DIAGNOSIS — I6523 Occlusion and stenosis of bilateral carotid arteries: Secondary | ICD-10-CM | POA: Diagnosis not present

## 2022-01-31 DIAGNOSIS — I10 Essential (primary) hypertension: Secondary | ICD-10-CM | POA: Diagnosis not present

## 2022-01-31 DIAGNOSIS — I480 Paroxysmal atrial fibrillation: Secondary | ICD-10-CM | POA: Diagnosis not present

## 2022-01-31 DIAGNOSIS — I6521 Occlusion and stenosis of right carotid artery: Secondary | ICD-10-CM | POA: Diagnosis not present

## 2022-01-31 DIAGNOSIS — E785 Hyperlipidemia, unspecified: Secondary | ICD-10-CM | POA: Diagnosis not present

## 2022-05-14 DIAGNOSIS — D0421 Carcinoma in situ of skin of right ear and external auricular canal: Secondary | ICD-10-CM | POA: Diagnosis not present

## 2022-05-14 DIAGNOSIS — D485 Neoplasm of uncertain behavior of skin: Secondary | ICD-10-CM | POA: Diagnosis not present

## 2022-05-14 DIAGNOSIS — D045 Carcinoma in situ of skin of trunk: Secondary | ICD-10-CM | POA: Diagnosis not present

## 2022-05-14 DIAGNOSIS — C44719 Basal cell carcinoma of skin of left lower limb, including hip: Secondary | ICD-10-CM | POA: Diagnosis not present

## 2022-06-11 DIAGNOSIS — I10 Essential (primary) hypertension: Secondary | ICD-10-CM | POA: Diagnosis not present

## 2022-06-11 DIAGNOSIS — C44222 Squamous cell carcinoma of skin of right ear and external auricular canal: Secondary | ICD-10-CM | POA: Diagnosis not present

## 2022-06-11 DIAGNOSIS — C44719 Basal cell carcinoma of skin of left lower limb, including hip: Secondary | ICD-10-CM | POA: Diagnosis not present

## 2022-06-11 DIAGNOSIS — L814 Other melanin hyperpigmentation: Secondary | ICD-10-CM | POA: Diagnosis not present

## 2022-06-11 DIAGNOSIS — L821 Other seborrheic keratosis: Secondary | ICD-10-CM | POA: Diagnosis not present

## 2022-09-13 DIAGNOSIS — D045 Carcinoma in situ of skin of trunk: Secondary | ICD-10-CM | POA: Diagnosis not present

## 2022-10-03 ENCOUNTER — Encounter: Payer: Self-pay | Admitting: Orthopaedic Surgery

## 2022-10-03 ENCOUNTER — Ambulatory Visit: Payer: Medicare HMO | Admitting: Orthopaedic Surgery

## 2022-10-03 ENCOUNTER — Ambulatory Visit (INDEPENDENT_AMBULATORY_CARE_PROVIDER_SITE_OTHER): Payer: Medicare HMO

## 2022-10-03 DIAGNOSIS — M1612 Unilateral primary osteoarthritis, left hip: Secondary | ICD-10-CM | POA: Insufficient documentation

## 2022-10-03 DIAGNOSIS — M25552 Pain in left hip: Secondary | ICD-10-CM | POA: Diagnosis not present

## 2022-10-03 NOTE — Progress Notes (Signed)
The patient is a 77 year old gentleman that I have not seen in a while.  5 years ago we replaced his right hip.  He says for the last several months he has been having worsening left hip pain now and groin pain.  He says his worst pain is waking her up at night.  He cannot take anti-inflammatories due to chronic kidney disease.  He does take Tylenol on occasion.  He does not walk with assistive device.  He is a thin individual.  He has had no other acute changes in medical status.  When I talk to him in more detail it sounds like this hip has been getting worse for some time now on the left side.  He has a harder time putting his shoes and socks and crossing his leg on the left side.  He says his right hip replacement is doing great and he has no issues with that side.  On exam his right operative hip moves smoothly and fluidly.  His left hip has stiffness with hip flexion as well as stiffness with internal and external rotation.  It is causing groin pain on the left side and thigh pain the left side.  His left knee exam is normal.  An AP pelvis and lateral of his left hip were reviewed and compared to films from 2018.  He now has significant osteoarthritis of the left hip with joint space narrowing and sclerotic changes.  We talked about his x-rays in detail.  At some point he may consider hip replacement on the left side.  He still works and is active at 65.  He would at least like to trial a one-time intra-articular steroid injection of the left hip so we will set him up an appointment to see Dr. Rolena Infante for an ultrasound-guided steroid injection in the left hip.  Follow-up can be as needed after that.  However, he will call us if he gets to the point where he would like to schedule hip replacement for his left hip.  All questions and concerns were answered and addressed.

## 2022-10-05 ENCOUNTER — Encounter: Payer: Self-pay | Admitting: Sports Medicine

## 2022-10-05 ENCOUNTER — Ambulatory Visit: Payer: Medicare HMO | Admitting: Sports Medicine

## 2022-10-05 ENCOUNTER — Ambulatory Visit: Payer: Self-pay

## 2022-10-05 VITALS — BP 166/96 | HR 60 | Wt 200.0 lb

## 2022-10-05 DIAGNOSIS — M1612 Unilateral primary osteoarthritis, left hip: Secondary | ICD-10-CM

## 2022-10-05 MED ORDER — LIDOCAINE HCL 1 % IJ SOLN
4.0000 mL | INTRAMUSCULAR | Status: AC | PRN
  Administered 2022-10-05: 4 mL

## 2022-10-05 MED ORDER — METHYLPREDNISOLONE ACETATE 40 MG/ML IJ SUSP
80.0000 mg | INTRAMUSCULAR | Status: AC | PRN
  Administered 2022-10-05: 80 mg via INTRA_ARTICULAR

## 2022-10-05 NOTE — Progress Notes (Signed)
   Procedure Note  Patient: Kyle Kidd             Date of Birth: 08-23-1945           MRN: [id number]             Visit Date: 10/05/2022  Procedures: Visit Diagnoses:  1. Unilateral primary osteoarthritis, left hip   2. Pain in left hip    Large Joint Inj: L hip joint on 10/05/2022 2:01 PM Indications: pain Details: 22 G 3.5 in needle, ultrasound-guided anterior approach Medications: 4 mL lidocaine 1 %; 80 mg methylPREDNISolone acetate 40 MG/ML Outcome: tolerated well, no immediate complications  Procedure: US-guided intra-articular hip injection, Left After discussion on risks/benefits/indications and informed verbal consent was obtained, a timeout was performed. Patient was lying supine on exam table. The hip was cleaned with betadine and alcohol swabs. Then utilizing ultrasound guidance, the patient's femoral head and neck junction was identified and subsequently injected with 4:2 lidocaine:depomedrol via an in-plane approach with ultrasound visualization of the injectate administered into the hip joint. Patient tolerated procedure well without immediate complications.  Procedure, treatment alternatives, risks and benefits explained, specific risks discussed. Consent was given by the patient. Immediately prior to procedure a time out was called to verify the correct patient, procedure, equipment, support staff and site/side marked as required. Patient was prepped and draped in the usual sterile fashion.     - I evaluated the patient about 10 minutes post-injection and he had improvement in pain and range of motion - follow-up with Dr. Ninfa Linden as indicated; I am happy to see them as needed  Elba Barman, DO [place]  This note was dictated using Dragon naturally speaking software and may contain errors in syntax, spelling, or content which have not been identified prior to signing this note.

## 2022-10-22 DIAGNOSIS — H25813 Combined forms of age-related cataract, bilateral: Secondary | ICD-10-CM | POA: Diagnosis not present

## 2023-01-31 DIAGNOSIS — Z7689 Persons encountering health services in other specified circumstances: Secondary | ICD-10-CM | POA: Diagnosis not present

## 2023-01-31 DIAGNOSIS — I6521 Occlusion and stenosis of right carotid artery: Secondary | ICD-10-CM | POA: Diagnosis not present

## 2023-01-31 DIAGNOSIS — I480 Paroxysmal atrial fibrillation: Secondary | ICD-10-CM | POA: Diagnosis not present

## 2023-01-31 DIAGNOSIS — I10 Essential (primary) hypertension: Secondary | ICD-10-CM | POA: Diagnosis not present

## 2023-05-08 ENCOUNTER — Ambulatory Visit: Payer: Medicare HMO | Admitting: Orthopaedic Surgery

## 2023-05-08 ENCOUNTER — Encounter: Payer: Self-pay | Admitting: Orthopaedic Surgery

## 2023-05-08 DIAGNOSIS — M25552 Pain in left hip: Secondary | ICD-10-CM | POA: Diagnosis not present

## 2023-05-08 DIAGNOSIS — M1612 Unilateral primary osteoarthritis, left hip: Secondary | ICD-10-CM

## 2023-05-08 NOTE — Progress Notes (Signed)
The patient is a 78 year old gentleman well-known to me.  We replaced his right hip successfully many years ago.  We saw him last year with debilitating left hip pain.  His x-rays showed significant arthritis of the left hip with joint space narrowing and osteophytes around the hip.  He did try an intra-articular steroid injection which she said lasted for only about 2 months.  At this point his left hip pain is daily and it is detrimentally affecting his mobility, his quality of life, and his actives daily living.  He is at the point where he does wish to proceed with a hip replacement on the left side.  He has had no acute changes in medical status.  He has seen a cardiologist and just has some high blood pressure which he treats accordingly.  I was able to review notes within epic on him and found no significant issues otherwise.  Having had this before he is fully aware of the risk of acute blood loss anemia, nerve or vessel injury, fracture, infection, DVT, implant failure, dislocation, leg length issues and wound healing issues.  He understands her goals are hopefully decrease pain, improve mobility and improve quality of life.  On exam his right total hip moves smoothly and fluidly with no pain at all.  His left hip has significant stiffness with internal and external rotation as well as flexion.  There is significant pain in the groin with rotating his left hip.  He does walk with a slight Trendelenburg gait with limping to the left side.  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  At this point we will work on scheduling him for a left total hip arthroplasty.  All questions and concerns were answered and addressed.  He knows we will be in touch for scheduling the surgery.

## 2023-06-10 NOTE — Pre-Procedure Instructions (Signed)
Surgical Instructions   Your procedure is scheduled on Tuesday, June 18th. Report to [facility] Main Entrance "A" at 10:00 A.M., then check in with the Admitting office. Any questions or running late day of surgery: call [phone]  Questions prior to your surgery date: call [phone], Monday-Friday, 8am-4pm. If you experience any cold or flu symptoms such as cough, fever, chills, shortness of breath, etc. between now and your scheduled surgery, please notify us at the above number.     Remember:  Do not eat after midnight the night before your surgery  You may drink clear liquids until 09:00 AM the morning of your surgery.   Clear liquids allowed are: Water, Non-Citrus Juices (without pulp), Carbonated Beverages, Clear Tea, Black Coffee Only (NO MILK, CREAM OR POWDERED CREAMER of any kind), and Gatorade.   Patient Instructions  The night before surgery:  No food after midnight. ONLY clear liquids after midnight  The day of surgery (if you do NOT have diabetes):  Drink ONE (1) Pre-Surgery Clear Ensure by 09:00 AM the morning of surgery. Drink in one sitting. Do not sip.  This drink was given to you during your hospital  pre-op appointment visit.  Nothing else to drink after completing the  Pre-Surgery Clear Ensure.          If you have questions, please contact your surgeon's office.     Take these medicines the morning of surgery with A SIP OF WATER  acetaminophen (TYLENOL)- if needed     One week prior to surgery, STOP taking any Aspirin (unless otherwise instructed by your surgeon) Aleve, Naproxen, Ibuprofen, Motrin, Advil, Goody's, BC's, all herbal medications, fish oil, and non-prescription vitamins.                     Do NOT Smoke (Tobacco/Vaping) for 24 hours prior to your procedure.  If you use a CPAP at night, you may bring your mask/headgear for your overnight stay.   You will be asked to remove any contacts, glasses, piercing's, hearing aid's,  dentures/partials prior to surgery. Please bring cases for these items if needed.    Patients discharged the day of surgery will not be allowed to drive home, and someone needs to stay with them for 24 hours.  SURGICAL WAITING ROOM VISITATION Patients may have no more than 2 support people in the waiting area - these visitors may rotate.   Pre-op nurse will coordinate an appropriate time for 1 ADULT support person, who may not rotate, to accompany patient in pre-op.  Children under the age of 21 must have an adult with them who is not the patient and must remain in the main waiting area with an adult.  If the patient needs to stay at the hospital during part of their recovery, the visitor guidelines for inpatient rooms apply.  Please refer to the [facility] website for the visitor guidelines for any additional information.   If you received a COVID test during your pre-op visit  it is requested that you wear a mask when out in public, stay away from anyone that may not be feeling well and notify your surgeon if you develop symptoms. If you have been in contact with anyone that has tested positive in the last 10 days please notify you surgeon.      Pre-operative 5 CHG Bath Instructions   You can play a key role in reducing the risk of infection after surgery. Your skin needs to be as free  of germs as possible. You can reduce the number of germs on your skin by washing with CHG (chlorhexidine gluconate) soap before surgery. CHG is an antiseptic soap that kills germs and continues to kill germs even after washing.   DO NOT use if you have an allergy to chlorhexidine/CHG or antibacterial soaps. If your skin becomes reddened or irritated, stop using the CHG and notify one of our RNs at [phone].   Please shower with the CHG soap starting 4 days before surgery using the following schedule:     Please keep in mind the following:  DO NOT shave, including legs and underarms, starting the  day of your first shower.   You may shave your face at any point before/day of surgery.  Place clean sheets on your bed the day you start using CHG soap. Use a clean washcloth (not used since being washed) for each shower. DO NOT sleep with pets once you start using the CHG.   CHG Shower Instructions:  If you choose to wash your hair and private area, wash first with your normal shampoo/soap.  After you use shampoo/soap, rinse your hair and body thoroughly to remove shampoo/soap residue.  Turn the water OFF and apply about 3 tablespoons (45 ml) of CHG soap to a CLEAN washcloth.  Apply CHG soap ONLY FROM YOUR NECK DOWN TO YOUR TOES (washing for 3-5 minutes)  DO NOT use CHG soap on face, private areas, open wounds, or sores.  Pay special attention to the area where your surgery is being performed.  If you are having back surgery, having someone wash your back for you may be helpful. Wait 2 minutes after CHG soap is applied, then you may rinse off the CHG soap.  Pat dry with a clean towel  Put on clean clothes/pajamas   If you choose to wear lotion, please use ONLY the CHG-compatible lotions on the back of this paper.   Additional instructions for the day of surgery: DO NOT APPLY any lotions, deodorants, cologne, or perfumes.   Do not bring valuables to the hospital. [facility] is not responsible for any belongings/valuables. Do not wear nail polish, gel polish, artificial nails, or any other type of covering on natural nails (fingers and toes) Do not wear jewelry or makeup Put on clean/comfortable clothes.  Please brush your teeth.  Ask your nurse before applying any prescription medications to the skin.     CHG Compatible Lotions   Aveeno Moisturizing lotion  Cetaphil Moisturizing Cream  Cetaphil Moisturizing Lotion  Clairol Herbal Essence Moisturizing Lotion, Dry Skin  Clairol Herbal Essence Moisturizing Lotion, Extra Dry Skin  Clairol Herbal Essence Moisturizing Lotion, Normal  Skin  Curel Age Defying Therapeutic Moisturizing Lotion with Alpha Hydroxy  Curel Extreme Care Body Lotion  Curel Soothing Hands Moisturizing Hand Lotion  Curel Therapeutic Moisturizing Cream, Fragrance-Free  Curel Therapeutic Moisturizing Lotion, Fragrance-Free  Curel Therapeutic Moisturizing Lotion, Original Formula  Eucerin Daily Replenishing Lotion  Eucerin Dry Skin Therapy Plus Alpha Hydroxy Crme  Eucerin Dry Skin Therapy Plus Alpha Hydroxy Lotion  Eucerin Original Crme  Eucerin Original Lotion  Eucerin Plus Crme Eucerin Plus Lotion  Eucerin TriLipid Replenishing Lotion  Keri Anti-Bacterial Hand Lotion  Keri Deep Conditioning Original Lotion Dry Skin Formula Softly Scented  Keri Deep Conditioning Original Lotion, Fragrance Free Sensitive Skin Formula  Keri Lotion Fast Absorbing Fragrance Free Sensitive Skin Formula  Keri Lotion Fast Absorbing Softly Scented Dry Skin Formula  Keri Original Lotion  Keri Skin Renewal Lotion  Keri Silky Smooth Lotion  Keri Silky Smooth Sensitive Skin Lotion  Nivea Body Creamy Conditioning Patent examiner Moisturizing Lotion Nivea Crme  Nivea Skin Firming Lotion  NutraDerm 30 Skin Lotion  NutraDerm Skin Lotion  NutraDerm Therapeutic Skin Cream  NutraDerm Therapeutic Skin Lotion  ProShield Protective Hand Cream  Provon moisturizing lotion  Please read over the following fact sheets that you were given.

## 2023-06-11 ENCOUNTER — Encounter (HOSPITAL_COMMUNITY): Payer: Self-pay

## 2023-06-11 ENCOUNTER — Encounter (HOSPITAL_COMMUNITY)
Admission: RE | Admit: 2023-06-11 | Discharge: 2023-06-11 | Disposition: A | Discharge status: Home / Self Care | Payer: Medicare HMO | Source: Ambulatory Visit | Attending: Orthopaedic Surgery | Admitting: Orthopaedic Surgery

## 2023-06-11 ENCOUNTER — Other Ambulatory Visit: Payer: Self-pay

## 2023-06-11 VITALS — BP 137/87 | HR 72 | Temp 97.7°F | Resp 17 | Ht 70.0 in | Wt 183.9 lb

## 2023-06-11 DIAGNOSIS — N1832 Chronic kidney disease, stage 3b: Secondary | ICD-10-CM | POA: Diagnosis not present

## 2023-06-11 DIAGNOSIS — I10 Essential (primary) hypertension: Secondary | ICD-10-CM

## 2023-06-11 DIAGNOSIS — I480 Paroxysmal atrial fibrillation: Secondary | ICD-10-CM | POA: Diagnosis not present

## 2023-06-11 DIAGNOSIS — Z79899 Other long term (current) drug therapy: Secondary | ICD-10-CM | POA: Diagnosis not present

## 2023-06-11 DIAGNOSIS — Z96641 Presence of right artificial hip joint: Secondary | ICD-10-CM | POA: Insufficient documentation

## 2023-06-11 DIAGNOSIS — I6521 Occlusion and stenosis of right carotid artery: Secondary | ICD-10-CM | POA: Insufficient documentation

## 2023-06-11 DIAGNOSIS — I083 Combined rheumatic disorders of mitral, aortic and tricuspid valves: Secondary | ICD-10-CM | POA: Diagnosis not present

## 2023-06-11 DIAGNOSIS — Z87442 Personal history of urinary calculi: Secondary | ICD-10-CM | POA: Insufficient documentation

## 2023-06-11 DIAGNOSIS — I129 Hypertensive chronic kidney disease with stage 1 through stage 4 chronic kidney disease, or unspecified chronic kidney disease: Secondary | ICD-10-CM | POA: Diagnosis not present

## 2023-06-11 DIAGNOSIS — Z8616 Personal history of COVID-19: Secondary | ICD-10-CM | POA: Insufficient documentation

## 2023-06-11 DIAGNOSIS — Z01818 Encounter for other preprocedural examination: Secondary | ICD-10-CM | POA: Diagnosis not present

## 2023-06-11 DIAGNOSIS — M1612 Unilateral primary osteoarthritis, left hip: Secondary | ICD-10-CM | POA: Diagnosis not present

## 2023-06-11 DIAGNOSIS — E785 Hyperlipidemia, unspecified: Secondary | ICD-10-CM | POA: Diagnosis not present

## 2023-06-11 HISTORY — DX: Paroxysmal atrial fibrillation: I48.0

## 2023-06-11 HISTORY — DX: Chronic kidney disease, unspecified: N18.9

## 2023-06-11 LAB — SURGICAL PCR SCREEN
MRSA, PCR: NEGATIVE
Staphylococcus aureus: NEGATIVE

## 2023-06-11 LAB — COMPREHENSIVE METABOLIC PANEL
ALT: 13 U/L (ref 0–44)
AST: 15 U/L (ref 15–41)
Albumin: 3.8 g/dL (ref 3.5–5.0)
Alkaline Phosphatase: 55 U/L (ref 38–126)
Anion gap: 9 (ref 5–15)
BUN: 25 mg/dL — ABNORMAL HIGH (ref 8–23)
CO2: 25 mmol/L (ref 22–32)
Calcium: 8.8 mg/dL — ABNORMAL LOW (ref 8.9–10.3)
Chloride: 106 mmol/L (ref 98–111)
Creatinine, Ser: 1.87 mg/dL — ABNORMAL HIGH (ref 0.61–1.24)
GFR, Estimated: 36 mL/min — ABNORMAL LOW (ref >60)
Glucose, Bld: 89 mg/dL (ref 70–99)
Potassium: 4.3 mmol/L (ref 3.5–5.1)
Sodium: 140 mmol/L (ref 135–145)
Total Bilirubin: 1 mg/dL (ref 0.3–1.2)
Total Protein: 6.1 g/dL — ABNORMAL LOW (ref 6.5–8.1)

## 2023-06-11 LAB — CBC
HCT: 44.2 % (ref 39.0–52.0)
Hemoglobin: 14.6 g/dL (ref 13.0–17.0)
MCH: 34 pg (ref 26.0–34.0)
MCHC: 33 g/dL (ref 30.0–36.0)
MCV: 103 fL — ABNORMAL HIGH (ref 80.0–100.0)
Platelets: 291 10*3/uL (ref 150–400)
RBC: 4.29 MIL/uL (ref 4.22–5.81)
RDW: 14 % (ref 11.5–15.5)
WBC: 7.7 10*3/uL (ref 4.0–10.5)
nRBC: 0 % (ref 0.0–0.2)

## 2023-06-11 NOTE — Progress Notes (Signed)
PCP - Dr. Gwendlyn Deutscher Cardiologist - Dr. Valera Castle- [facility]  PPM/ICD - denies  Chest x-ray - "many years ago" per pt, pt is unsure where EKG - 06/11/23 Stress Test - denies ECHO - 2021- [facility] Cardiac Cath - denies  Sleep Study - denies   DM- denies   ASA/Blood Thinner Instructions: n/a   ERAS Protcol - yes PRE-SURGERY Ensure given at PAT  COVID TEST- n/a   Anesthesia review: yes, cardiac hx  Patient denies shortness of breath, fever, cough and chest pain at PAT appointment   All instructions explained to the patient, with a verbal understanding of the material. Patient agrees to go over the instructions while at home for a better understanding. The opportunity to ask questions was provided.

## 2023-06-12 ENCOUNTER — Encounter (HOSPITAL_COMMUNITY): Payer: Self-pay

## 2023-06-12 NOTE — Progress Notes (Signed)
Anesthesia Chart Review:  Case: [id number] Date/Time: 06/18/23 1154   Procedure: LEFT TOTAL HIP ARTHROPLASTY ANTERIOR APPROACH (Left: Hip)   Anesthesia type: Spinal   Pre-op diagnosis: osteoarthritis left hip   [facility]: [facility] ROOM 07 / [facility] OR   Surgeons: Kyle Hitch, MD       DISCUSSION: Patient is a 78 year old male scheduled for the above procedure.  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   06/11/23 EKG at PAT was read as accelerated junctional rhythm at 67 bpm. I think there are P waves in V1. Kyle Kidd is followed by cardiologist Dr. Valera Kidd in [place], [place] for PAF, HTN, and carotid artery stenosis. Last visit 01/31/23. Cardiac and vascular status were stable at that time. Continue current medications. I called and spoke with Kyle Kidd. EKG also faxed for his review. Although tracing technically challenging, he felt it represented sinus rhythm. Patient not on b-blocker. He is on losartan. Kyle Kidd noted that historically patient has been resistant to medications and had declined ASA for carotid disease. He was felt stable at visit within the past six months, so in the absence of any new symptoms Kyle Kidd did not have any additional preoperative recommendations. Kyle Kidd denied chest pain, SOB, dizziness/syncope.   Creatinine 1.87, BUN 25, eGFR 36. He has known CKD stage 3b for > 10 years. Previously available Creatinine 1.91 on 10/31/21.   Anesthesia team to evaluate on the day of surgery.    VS: BP 137/87   Pulse 72   Temp 36.5 C   Resp 17   Ht 5\' 10"  (1.778 m)   Wt 83.4 kg   SpO2 98%   BMI 26.39 kg/m    PROVIDERS: Kyle Saupe, MD is PCP  Kyle Castle, MD iw cardiologist  [facility] in [place], [place]; phone: [phone]; fax [phone]) - Prior nephrology evaluation by Kathrene Bongo, MD (attending) with [facility]. Last visti 09/22/19. Known CKD since at least 2007, Stage 3b. Negative renal US 01/01/14.    LABS: Preoperative labs noted. See DISCUSSION. (all labs ordered are listed, but only abnormal results are displayed)  Labs Reviewed  CBC - Abnormal; Notable for the following components:      Result Value   MCV 103.0 (*)    All other components within normal limits  COMPREHENSIVE METABOLIC PANEL - Abnormal; Notable for the following components:   BUN 25 (*)    Creatinine, Ser 1.87 (*)    Calcium 8.8 (*)    Total Protein 6.1 (*)    GFR, Estimated 36 (*)    All other components within normal limits  SURGICAL PCR SCREEN     IMAGES: Xray Left Hip 10/03/22: "He now has significant osteoarthritis of the left hip with joint space narrowing and sclerotic changes."    EKG: EKG 06/11/23: Accelerated Junctional rhythm Low voltage QRS Confirmed by Kyle Kidd [phone]) on 06/11/2023 10:13:45 PM - See DISCUSSION.   CV: US Carotid 11/07/21 ([facility]): IMPRESSION: 1. Elevated peak systolic velocity at the right ICA corresponding with 41-59% stenosis.  2. No hemodynamically significant stenosis in the left internal carotid artery.  3. Antegrade flow within the bilateral vertebral arteries.    Echo 02/10/20 ([facility]): Conclusions:  Rhythm:  Normal sinus.  - There is concentric, mild hypertrophy of the left ventricle.  However, left ventricular function (systolic+diastolic) appears normal. There is normal left ventricular systolic function. The estimated ejection fraction is  55-60%. Right ventricular structure and contractility appear normal.  - The left and right atria each appear normal.  - The aortic valve is trileaflet. The aortic valve leaflets are moderately  thickened. Mild  aortic leaflet calcification is visualized. There is no  evidence of aortic stenosis. There is no evidence of aortic  regurgitation.  - The mitral valve leaflets are mildly thickened. There is no evidence of  mitral stenosis. There is mild mitral regurgitation. [Trace/mild]  - The tricuspid valve leaflets are not thickened. There is no tricuspid  stenosis. There is mild tricuspid regurgitation.[Trace/mild] There is  evidence of borderline pulmonary hypertension.  - The aortic root appears normal.  - There is a trivial pericardial effusion (probably physiologic).    Past Medical History:  Diagnosis Date   Arthritis    Cancer (HCC)    skin cancer removed from back, left leg, and right ear   Carotid bruit    right; 41-59% RICA stenosis 11/07/21 [place]   CKD (chronic kidney disease)    Heart murmur    LONG time ago...while in [employer]. No problems or mention of it since.   History of kidney stones    last stone 4-5 yrs ago   Hyperlipidemia    Hypertension    Paroxysmal A-fib (HCC)    in setting of COVID-19 01/2020, no known recurrence (06/12/23)    Past Surgical History:  Procedure Laterality Date   EYE SURGERY     lasik on OD  10 yrs ago   LITHOTRIPSY     x1   SKIN CANCER EXCISION  2001   removed from back, left leg and right ear   TONSILLECTOMY     TOTAL HIP ARTHROPLASTY Right 10/01/2017   Procedure: RIGHT TOTAL HIP ARTHROPLASTY ANTERIOR APPROACH;  Surgeon: Kyle Hitch, MD;  Location: [facility] OR;  Service: Orthopedics;  Laterality: Right;    MEDICATIONS:  acetaminophen (TYLENOL) 500 MG tablet   losartan (COZAAR) 25 MG tablet   No current facility-administered medications for this encounter.    Kyle Chock, PA-C Surgical Short Stay/Anesthesiology [facility] Phone [phone] [facility] Phone [phone] 06/12/2023 2:36 PM

## 2023-06-12 NOTE — Anesthesia Preprocedure Evaluation (Addendum)
Anesthesia Evaluation  Patient identified by MRN, date of birth, ID band Patient awake    Reviewed: Allergy & Precautions, H&P , NPO status , Patient's Chart, lab work & pertinent test results  Airway Mallampati: II   Neck ROM: full    Dental   Pulmonary neg pulmonary ROS   breath sounds clear to auscultation       Cardiovascular hypertension, (-) dysrhythmias  Rhythm:regular Rate:Normal     Neuro/Psych    GI/Hepatic   Endo/Other    Renal/GU Renal InsufficiencyRenal diseaseStones. Cr 1.87     Musculoskeletal  (+) Arthritis ,    Abdominal   Peds  Hematology   Anesthesia Other Findings   Reproductive/Obstetrics                             Anesthesia Physical Anesthesia Plan  ASA: 3  Anesthesia Plan: Spinal   Post-op Pain Management:    Induction: Intravenous  PONV Risk Score and Plan: 1 and Ondansetron, Propofol infusion and Treatment may vary due to age or medical condition  Airway Management Planned:   Additional Equipment:   Intra-op Plan:   Post-operative Plan:   Informed Consent: I have reviewed the patients History and Physical, chart, labs and discussed the procedure including the risks, benefits and alternatives for the proposed anesthesia with the patient or authorized representative who has indicated his/her understanding and acceptance.     Dental advisory given  Plan Discussed with: CRNA, Anesthesiologist and Surgeon  Anesthesia Plan Comments: (PAT note written 06/12/2023 by Shonna Chock, PA-C. 06/11/23 EKG reviewed with patient's cardiologist Dr. Valera Castle and felt to represent SR.  )       Anesthesia Quick Evaluation

## 2023-06-17 ENCOUNTER — Telehealth: Payer: Self-pay | Admitting: *Deleted

## 2023-06-17 NOTE — Care Plan (Signed)
RNCM call to patient to discuss his upcoming Left total hip arthroplasty with Dr. Magnus Ivan on 06/18/23. He is an Ortho bundle patient through [facility] and is agreeable to case management. He has a girlfriend that will be assisting after discharge. He has a RW already at home. Anticipate HHPT will be needed after a short hospital stay. Referral made to [facility] after choice provided. Reviewed post op care instructions. Will continue to follow for needs.

## 2023-06-17 NOTE — H&P (Signed)
TOTAL HIP ADMISSION H&P  Patient is admitted for left total hip arthroplasty.  Subjective:  Chief Complaint: left hip pain  HPI: Kyle Kidd, 78 y.o. male, has a history of pain and functional disability in the left hip(s) due to arthritis and patient has failed non-surgical conservative treatments for greater than 12 weeks to include NSAID's and/or analgesics, corticosteriod injections, flexibility and strengthening excercises, and activity modification.  Onset of symptoms was gradual starting 3 years ago with gradually worsening course since that time.The patient noted no past surgery on the left hip(s).  Patient currently rates pain in the left hip at 10 out of 10 with activity. Patient has night pain, worsening of pain with activity and weight bearing, trendelenberg gait, pain that interfers with activities of daily living, and pain with passive range of motion. Patient has evidence of subchondral sclerosis, periarticular osteophytes, and joint space narrowing by imaging studies. This condition presents safety issues increasing the risk of falls.  There is no current active infection.  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 No problems or mention of it since.   History of kidney stones    last stone 4-5 yrs ago   Hyperlipidemia    Hypertension    Paroxysmal A-fib (HCC)    in setting of COVID-19 01/2020, no known recurrence (06/12/23)    Past Surgical History:   Procedure Laterality Date   EYE SURGERY     lasik on OD  10 yrs ago   LITHOTRIPSY     x1   SKIN CANCER EXCISION  2001   removed from back, left leg and right ear   TONSILLECTOMY     TOTAL HIP ARTHROPLASTY Right 10/01/2017   Procedure: RIGHT TOTAL HIP ARTHROPLASTY ANTERIOR APPROACH;  Surgeon: Kathryne Hitch, MD;  Location: [facility] OR;  Service: Orthopedics;  Laterality: Right;    No current facility-administered medications for this encounter.   Current Outpatient Medications  Medication Sig Dispense Refill Last Dose   acetaminophen (TYLENOL) 500 MG tablet Take 1,000 mg by mouth every 8 (eight) hours as needed for mild pain or headache.      losartan (COZAAR) 25 MG tablet Take 25 mg by mouth every morning.      Allergies  Allergen Reactions   Tizanidine Shortness Of Breath    Zanaflex.  Swollen tongue.    Rosuvastatin     Severe muscle aches    Social History   Tobacco Use   Smoking status: Never   Smokeless tobacco: Never  Substance Use Topics   Alcohol use: No    No family history on file.   Review of Systems  Objective:  Physical Exam Vitals reviewed.  Constitutional:      Appearance: Normal appearance. He is normal weight.  HENT:     Head: Normocephalic and atraumatic.  Eyes:     Extraocular Movements: Extraocular movements intact.  Pupils: Pupils are equal, round, and reactive to light.  Cardiovascular:     Rate and Rhythm: Normal rate and regular rhythm.     Pulses: Normal pulses.  Pulmonary:     Effort: Pulmonary effort is normal.     Breath sounds: Normal breath sounds.  Abdominal:     Palpations: Abdomen is soft.  Musculoskeletal:     Cervical back: Normal range of motion and neck supple.     Left hip: Tenderness and bony tenderness present. Decreased range of motion. Decreased strength.  Neurological:     Mental Status: He is alert and oriented to person, place, and time.  Psychiatric:        Behavior: Behavior normal.     Vital  signs in last 24 hours:    Labs:   Estimated body mass index is 26.39 kg/m as calculated from the following:   Height as of 06/11/23: 5\' 10"  (1.778 m).   Weight as of 06/11/23: 83.4 kg.   Imaging Review Plain radiographs demonstrate severe degenerative joint disease of the left hip(s). The bone quality appears to be good for age and reported activity level.      Assessment/Plan:  End stage arthritis, left hip(s)  The patient history, physical examination, clinical judgement of the provider and imaging studies are consistent with end stage degenerative joint disease of the left hip(s) and total hip arthroplasty is deemed medically necessary. The treatment options including medical management, injection therapy, arthroscopy and arthroplasty were discussed at length. The risks and benefits of total hip arthroplasty were presented and reviewed. The risks due to aseptic loosening, infection, stiffness, dislocation/subluxation,  thromboembolic complications and other imponderables were discussed.  The patient acknowledged the explanation, agreed to proceed with the plan and consent was signed. Patient is being admitted for inpatient treatment for surgery, pain control, PT, OT, prophylactic antibiotics, VTE prophylaxis, progressive ambulation and ADL's and discharge planning.The patient is planning to be discharged home with home health services

## 2023-06-17 NOTE — Telephone Encounter (Signed)
Ortho bundle pre-op call completed. 

## 2023-06-18 ENCOUNTER — Ambulatory Visit (HOSPITAL_COMMUNITY): Payer: Medicare HMO | Admitting: Physician Assistant

## 2023-06-18 ENCOUNTER — Ambulatory Visit (HOSPITAL_COMMUNITY): Payer: Medicare HMO

## 2023-06-18 ENCOUNTER — Inpatient Hospital Stay (HOSPITAL_COMMUNITY)
Admission: RE | Admit: 2023-06-18 | Discharge: 2023-06-21 | DRG: 470 | Disposition: A | Discharge status: Home / Self Care | Payer: Medicare HMO | Attending: Orthopaedic Surgery | Admitting: Orthopaedic Surgery

## 2023-06-18 ENCOUNTER — Encounter (HOSPITAL_COMMUNITY)
Admission: RE | Disposition: A | Discharge status: Home / Self Care | Payer: Self-pay | Source: Home / Self Care | Attending: Orthopaedic Surgery

## 2023-06-18 ENCOUNTER — Encounter (HOSPITAL_COMMUNITY): Payer: Self-pay | Admitting: Orthopaedic Surgery

## 2023-06-18 ENCOUNTER — Other Ambulatory Visit: Payer: Self-pay

## 2023-06-18 ENCOUNTER — Ambulatory Visit (HOSPITAL_BASED_OUTPATIENT_CLINIC_OR_DEPARTMENT_OTHER): Payer: Medicare HMO | Admitting: Certified Registered"

## 2023-06-18 DIAGNOSIS — Z96641 Presence of right artificial hip joint: Secondary | ICD-10-CM | POA: Diagnosis present

## 2023-06-18 DIAGNOSIS — N200 Calculus of kidney: Secondary | ICD-10-CM | POA: Diagnosis not present

## 2023-06-18 DIAGNOSIS — Z96642 Presence of left artificial hip joint: Secondary | ICD-10-CM

## 2023-06-18 DIAGNOSIS — Z79899 Other long term (current) drug therapy: Secondary | ICD-10-CM | POA: Diagnosis not present

## 2023-06-18 DIAGNOSIS — I10 Essential (primary) hypertension: Secondary | ICD-10-CM | POA: Diagnosis present

## 2023-06-18 DIAGNOSIS — N289 Disorder of kidney and ureter, unspecified: Secondary | ICD-10-CM | POA: Diagnosis not present

## 2023-06-18 DIAGNOSIS — Z8616 Personal history of COVID-19: Secondary | ICD-10-CM | POA: Diagnosis not present

## 2023-06-18 DIAGNOSIS — Z471 Aftercare following joint replacement surgery: Secondary | ICD-10-CM | POA: Diagnosis not present

## 2023-06-18 DIAGNOSIS — I480 Paroxysmal atrial fibrillation: Secondary | ICD-10-CM | POA: Diagnosis present

## 2023-06-18 DIAGNOSIS — Z888 Allergy status to other drugs, medicaments and biological substances status: Secondary | ICD-10-CM | POA: Diagnosis not present

## 2023-06-18 DIAGNOSIS — E785 Hyperlipidemia, unspecified: Secondary | ICD-10-CM | POA: Diagnosis present

## 2023-06-18 DIAGNOSIS — M1612 Unilateral primary osteoarthritis, left hip: Secondary | ICD-10-CM | POA: Diagnosis present

## 2023-06-18 DIAGNOSIS — M25552 Pain in left hip: Secondary | ICD-10-CM | POA: Diagnosis present

## 2023-06-18 DIAGNOSIS — Z85828 Personal history of other malignant neoplasm of skin: Secondary | ICD-10-CM

## 2023-06-18 HISTORY — PX: TOTAL HIP ARTHROPLASTY: SHX124

## 2023-06-18 SURGERY — ARTHROPLASTY, HIP, TOTAL, ANTERIOR APPROACH
Anesthesia: Spinal | Site: Hip | Laterality: Left

## 2023-06-18 MED ORDER — OXYCODONE HCL 5 MG PO TABS
10.0000 mg | ORAL_TABLET | ORAL | Status: DC | PRN
  Administered 2023-06-18: 15 mg via ORAL
  Administered 2023-06-19: 10 mg via ORAL
  Filled 2023-06-18: qty 3
  Filled 2023-06-18: qty 2

## 2023-06-18 MED ORDER — ACETAMINOPHEN 325 MG PO TABS: 325.0000 mg | ORAL_TABLET | Freq: Four times a day (QID) | ORAL | Status: DC | PRN

## 2023-06-18 MED ORDER — PROPOFOL 10 MG/ML IV BOLUS
INTRAVENOUS | Status: DC | PRN
  Administered 2023-06-18: 50 ug/kg/min via INTRAVENOUS
  Administered 2023-06-18: 50 mg via INTRAVENOUS
  Administered 2023-06-18: 30 mg via INTRAVENOUS

## 2023-06-18 MED ORDER — ONDANSETRON HCL 4 MG/2ML IJ SOLN: 4.0000 mg | Freq: Four times a day (QID) | INTRAMUSCULAR | Status: DC | PRN

## 2023-06-18 MED ORDER — OXYCODONE HCL 5 MG PO TABS: 5.0000 mg | ORAL_TABLET | Freq: Once | ORAL | Status: DC | PRN

## 2023-06-18 MED ORDER — SODIUM CHLORIDE 0.9 % IV SOLN: INTRAVENOUS | Status: DC

## 2023-06-18 MED ORDER — SUCCINYLCHOLINE CHLORIDE 200 MG/10ML IV SOSY
PREFILLED_SYRINGE | INTRAVENOUS | Status: AC
  Filled 2023-06-18: qty 10

## 2023-06-18 MED ORDER — ORAL CARE MOUTH RINSE: 15.0000 mL | OROMUCOSAL | Status: DC | PRN

## 2023-06-18 MED ORDER — PANTOPRAZOLE SODIUM 40 MG PO TBEC
40.0000 mg | DELAYED_RELEASE_TABLET | Freq: Every day | ORAL | Status: DC
  Administered 2023-06-18 – 2023-06-21 (×4): 40 mg via ORAL
  Filled 2023-06-18 (×4): qty 1

## 2023-06-18 MED ORDER — TAMSULOSIN HCL 0.4 MG PO CAPS
0.4000 mg | ORAL_CAPSULE | Freq: Every day | ORAL | Status: DC
  Administered 2023-06-18 – 2023-06-20 (×3): 0.4 mg via ORAL
  Filled 2023-06-18 (×3): qty 1

## 2023-06-18 MED ORDER — BUPIVACAINE IN DEXTROSE 0.75-8.25 % IT SOLN
INTRATHECAL | Status: DC | PRN
  Administered 2023-06-18: 1.8 mL via INTRATHECAL

## 2023-06-18 MED ORDER — LIDOCAINE 2% (20 MG/ML) 5 ML SYRINGE
INTRAMUSCULAR | Status: AC
  Filled 2023-06-18: qty 5

## 2023-06-18 MED ORDER — ASPIRIN 81 MG PO CHEW
81.0000 mg | CHEWABLE_TABLET | Freq: Two times a day (BID) | ORAL | Status: DC
  Administered 2023-06-18 – 2023-06-21 (×6): 81 mg via ORAL
  Filled 2023-06-18 (×6): qty 1

## 2023-06-18 MED ORDER — SODIUM CHLORIDE 0.9 % IR SOLN
Status: DC | PRN
  Administered 2023-06-18: 1000 mL

## 2023-06-18 MED ORDER — DOCUSATE SODIUM 100 MG PO CAPS
100.0000 mg | ORAL_CAPSULE | Freq: Two times a day (BID) | ORAL | Status: DC
  Administered 2023-06-18 – 2023-06-19 (×2): 100 mg via ORAL
  Filled 2023-06-18 (×6): qty 1

## 2023-06-18 MED ORDER — TRANEXAMIC ACID-NACL 1000-0.7 MG/100ML-% IV SOLN
INTRAVENOUS | Status: AC
  Filled 2023-06-18: qty 100

## 2023-06-18 MED ORDER — LACTATED RINGERS IV SOLN: INTRAVENOUS | Status: DC

## 2023-06-18 MED ORDER — ORAL CARE MOUTH RINSE: 15.0000 mL | Freq: Once | OROMUCOSAL | Status: AC

## 2023-06-18 MED ORDER — METOCLOPRAMIDE HCL 5 MG PO TABS: 5.0000 mg | ORAL_TABLET | Freq: Three times a day (TID) | ORAL | Status: DC | PRN

## 2023-06-18 MED ORDER — ONDANSETRON HCL 4 MG PO TABS: 4.0000 mg | ORAL_TABLET | Freq: Four times a day (QID) | ORAL | Status: DC | PRN

## 2023-06-18 MED ORDER — OXYCODONE HCL 5 MG PO TABS
5.0000 mg | ORAL_TABLET | ORAL | Status: DC | PRN
  Administered 2023-06-18 – 2023-06-19 (×3): 10 mg via ORAL
  Filled 2023-06-18 (×3): qty 2

## 2023-06-18 MED ORDER — HYDROMORPHONE HCL 1 MG/ML IJ SOLN: 0.5000 mg | INTRAMUSCULAR | Status: DC | PRN

## 2023-06-18 MED ORDER — METOCLOPRAMIDE HCL 5 MG/ML IJ SOLN: 5.0000 mg | Freq: Three times a day (TID) | INTRAMUSCULAR | Status: DC | PRN

## 2023-06-18 MED ORDER — CHLORHEXIDINE GLUCONATE 0.12 % MT SOLN
OROMUCOSAL | Status: AC
  Administered 2023-06-18: 15 mL via OROMUCOSAL
  Filled 2023-06-18: qty 15

## 2023-06-18 MED ORDER — PHENOL 1.4 % MT LIQD: 1.0000 | OROMUCOSAL | Status: DC | PRN

## 2023-06-18 MED ORDER — CEFAZOLIN SODIUM-DEXTROSE 2-4 GM/100ML-% IV SOLN
INTRAVENOUS | Status: AC
  Filled 2023-06-18: qty 100

## 2023-06-18 MED ORDER — METHOCARBAMOL 500 MG PO TABS
500.0000 mg | ORAL_TABLET | Freq: Four times a day (QID) | ORAL | Status: DC | PRN
  Filled 2023-06-18: qty 1

## 2023-06-18 MED ORDER — CEFAZOLIN SODIUM-DEXTROSE 2-4 GM/100ML-% IV SOLN
2.0000 g | INTRAVENOUS | Status: AC
  Administered 2023-06-18: 2 g via INTRAVENOUS

## 2023-06-18 MED ORDER — LIDOCAINE HCL (CARDIAC) PF 100 MG/5ML IV SOSY
PREFILLED_SYRINGE | INTRAVENOUS | Status: DC | PRN
  Administered 2023-06-18: 30 mg via INTRAVENOUS

## 2023-06-18 MED ORDER — METHOCARBAMOL 1000 MG/10ML IJ SOLN: 500.0000 mg | Freq: Four times a day (QID) | INTRAVENOUS | Status: DC | PRN

## 2023-06-18 MED ORDER — POVIDONE-IODINE 10 % EX SWAB
2.0000 | Freq: Once | CUTANEOUS | Status: AC
  Administered 2023-06-18: 2 via TOPICAL

## 2023-06-18 MED ORDER — OXYCODONE HCL 5 MG/5ML PO SOLN: 5.0000 mg | Freq: Once | ORAL | Status: DC | PRN

## 2023-06-18 MED ORDER — DEXAMETHASONE SODIUM PHOSPHATE 10 MG/ML IJ SOLN
INTRAMUSCULAR | Status: AC
  Filled 2023-06-18: qty 1

## 2023-06-18 MED ORDER — FENTANYL CITRATE (PF) 100 MCG/2ML IJ SOLN: 25.0000 ug | INTRAMUSCULAR | Status: DC | PRN

## 2023-06-18 MED ORDER — CEFAZOLIN SODIUM-DEXTROSE 1-4 GM/50ML-% IV SOLN
1.0000 g | Freq: Four times a day (QID) | INTRAVENOUS | Status: AC
  Administered 2023-06-18 (×2): 1 g via INTRAVENOUS
  Filled 2023-06-18 (×2): qty 50

## 2023-06-18 MED ORDER — LOSARTAN POTASSIUM 25 MG PO TABS
25.0000 mg | ORAL_TABLET | Freq: Every morning | ORAL | Status: DC
  Administered 2023-06-19 – 2023-06-21 (×2): 25 mg via ORAL
  Filled 2023-06-18 (×3): qty 1

## 2023-06-18 MED ORDER — DIPHENHYDRAMINE HCL 12.5 MG/5ML PO ELIX: 12.5000 mg | ORAL_SOLUTION | ORAL | Status: DC | PRN

## 2023-06-18 MED ORDER — ALUM & MAG HYDROXIDE-SIMETH 200-200-20 MG/5ML PO SUSP: 30.0000 mL | ORAL | Status: DC | PRN

## 2023-06-18 MED ORDER — CHLORHEXIDINE GLUCONATE 0.12 % MT SOLN: 15.0000 mL | Freq: Once | OROMUCOSAL | Status: AC

## 2023-06-18 MED ORDER — ONDANSETRON HCL 4 MG/2ML IJ SOLN
INTRAMUSCULAR | Status: AC
  Filled 2023-06-18: qty 2

## 2023-06-18 MED ORDER — 0.9 % SODIUM CHLORIDE (POUR BTL) OPTIME
TOPICAL | Status: DC | PRN
  Administered 2023-06-18: 1000 mL

## 2023-06-18 MED ORDER — TRANEXAMIC ACID-NACL 1000-0.7 MG/100ML-% IV SOLN
1000.0000 mg | INTRAVENOUS | Status: AC
  Administered 2023-06-18: 1000 mg via INTRAVENOUS

## 2023-06-18 MED ORDER — MENTHOL 3 MG MT LOZG: 1.0000 | LOZENGE | OROMUCOSAL | Status: DC | PRN

## 2023-06-18 MED ORDER — PHENYLEPHRINE HCL-NACL 20-0.9 MG/250ML-% IV SOLN
INTRAVENOUS | Status: DC | PRN
  Administered 2023-06-18: 20 ug/min via INTRAVENOUS

## 2023-06-18 SURGICAL SUPPLY — 56 items
APL SKNCLS STERI-STRIP NONHPOA (GAUZE/BANDAGES/DRESSINGS) ×1
ARTICULEZE HEAD (Hips) ×1 IMPLANT
BAG COUNTER SPONGE SURGICOUNT (BAG) ×1 IMPLANT
BAG SPNG CNTER NS LX DISP (BAG) ×1
BENZOIN TINCTURE PRP APPL 2/3 (GAUZE/BANDAGES/DRESSINGS) ×1 IMPLANT
BLADE CLIPPER SURG (BLADE) IMPLANT
BLADE SAW SGTL 18X1.27X75 (BLADE) ×1 IMPLANT
COLLAR OFFSET CORAIL SZ 11 HIP (Stem) IMPLANT
CORAIL OFFSET COLLAR SZ 11 HIP (Stem) ×1 IMPLANT
COVER SURGICAL LIGHT HANDLE (MISCELLANEOUS) ×1 IMPLANT
CUP SECTOR GRIPTON 58MM (Orthopedic Implant) IMPLANT
DRAPE C-ARM 42X72 X-RAY (DRAPES) ×1 IMPLANT
DRAPE STERI IOBAN 125X83 (DRAPES) ×1 IMPLANT
DRAPE U-SHAPE 47X51 STRL (DRAPES) ×3 IMPLANT
DRSG AQUACEL AG ADV 3.5X10 (GAUZE/BANDAGES/DRESSINGS) ×1 IMPLANT
DURAPREP 26ML APPLICATOR (WOUND CARE) ×1 IMPLANT
ELECT BLADE 4.0 EZ CLEAN MEGAD (MISCELLANEOUS) ×1
ELECT BLADE 6.5 EXT (BLADE) IMPLANT
ELECT REM PT RETURN 9FT ADLT (ELECTROSURGICAL) ×1
ELECTRODE BLDE 4.0 EZ CLN MEGD (MISCELLANEOUS) ×1 IMPLANT
ELECTRODE REM PT RTRN 9FT ADLT (ELECTROSURGICAL) ×1 IMPLANT
FACESHIELD WRAPAROUND (MASK) ×2 IMPLANT
FACESHIELD WRAPAROUND OR TEAM (MASK) ×2 IMPLANT
GLOVE BIOGEL PI IND STRL 8 (GLOVE) ×2 IMPLANT
GLOVE ECLIPSE 8.0 STRL XLNG CF (GLOVE) ×1 IMPLANT
GLOVE ORTHO TXT STRL SZ7.5 (GLOVE) ×2 IMPLANT
GOWN STRL REUS W/ TWL LRG LVL3 (GOWN DISPOSABLE) ×2 IMPLANT
GOWN STRL REUS W/ TWL XL LVL3 (GOWN DISPOSABLE) ×2 IMPLANT
GOWN STRL REUS W/TWL LRG LVL3 (GOWN DISPOSABLE) ×2
GOWN STRL REUS W/TWL XL LVL3 (GOWN DISPOSABLE) ×2
HANDPIECE INTERPULSE COAX TIP (DISPOSABLE) ×1
HEAD ARTICULEZE (Hips) IMPLANT
KIT BASIN OR (CUSTOM PROCEDURE TRAY) ×1 IMPLANT
KIT TURNOVER KIT B (KITS) ×1 IMPLANT
LINER NEUTRAL 36X58 PLUS4 IMPLANT
MANIFOLD NEPTUNE II (INSTRUMENTS) ×1 IMPLANT
NS IRRIG 1000ML POUR BTL (IV SOLUTION) ×1 IMPLANT
PACK TOTAL JOINT (CUSTOM PROCEDURE TRAY) ×1 IMPLANT
PAD ARMBOARD 7.5X6 YLW CONV (MISCELLANEOUS) ×1 IMPLANT
SET HNDPC FAN SPRY TIP SCT (DISPOSABLE) ×1 IMPLANT
STAPLER VISISTAT 35W (STAPLE) IMPLANT
STRIP CLOSURE SKIN 1/2X4 (GAUZE/BANDAGES/DRESSINGS) ×2 IMPLANT
SUT ETHIBOND NAB CT1 #1 30IN (SUTURE) ×1 IMPLANT
SUT MNCRL AB 4-0 PS2 18 (SUTURE) IMPLANT
SUT VIC AB 0 CT1 27 (SUTURE) ×1
SUT VIC AB 0 CT1 27XBRD ANBCTR (SUTURE) ×1 IMPLANT
SUT VIC AB 1 CT1 27 (SUTURE) ×1
SUT VIC AB 1 CT1 27XBRD ANBCTR (SUTURE) ×1 IMPLANT
SUT VIC AB 2-0 CT1 27 (SUTURE) ×1
SUT VIC AB 2-0 CT1 TAPERPNT 27 (SUTURE) ×1 IMPLANT
TOWEL GREEN STERILE (TOWEL DISPOSABLE) ×1 IMPLANT
TOWEL GREEN STERILE FF (TOWEL DISPOSABLE) ×1 IMPLANT
TRAY CATH INTERMITTENT SS 16FR (CATHETERS) IMPLANT
TRAY FOLEY W/BAG SLVR 16FR (SET/KITS/TRAYS/PACK)
TRAY FOLEY W/BAG SLVR 16FR ST (SET/KITS/TRAYS/PACK) IMPLANT
WATER STERILE IRR 1000ML POUR (IV SOLUTION) ×2 IMPLANT

## 2023-06-18 NOTE — Anesthesia Procedure Notes (Signed)
Spinal  Patient location during procedure: OR Start time: 06/18/2023 10:53 AM End time: 06/18/2023 10:55 AM Reason for block: surgical anesthesia Staffing Performed: anesthesiologist  Anesthesiologist: Achille Rich, MD Performed by: Achille Rich, MD Authorized by: Achille Rich, MD   Preanesthetic Checklist Completed: patient identified, IV checked, risks and benefits discussed, surgical consent, monitors and equipment checked, pre-op evaluation and timeout performed Spinal Block Patient position: sitting Prep: DuraPrep Patient monitoring: cardiac monitor, continuous pulse ox and blood pressure Approach: midline Location: L3-4 Injection technique: single-shot Needle Needle type: Pencan  Needle gauge: 24 G Needle length: 9 cm Assessment Sensory level: T10 Events: CSF return Additional Notes Functioning IV was confirmed and monitors were applied. Sterile prep and drape, including hand hygiene and sterile gloves were used. The patient was positioned and the spine was prepped. The skin was anesthetized with lidocaine.  Free flow of clear CSF was obtained prior to injecting local anesthetic into the CSF.  The spinal needle aspirated freely following injection.  The needle was carefully withdrawn.  The patient tolerated the procedure well.

## 2023-06-18 NOTE — Interval H&P Note (Signed)
History and Physical Interval Note: The patient understands that he is here today for a left total hip replacement to treat his severe left hip arthritis.  There has been no acute or interval change in his medical status.  The risks and benefits of surgery have been discussed in detail and informed consent has been obtained.  The left operative hip has been marked.  06/18/2023 10:46 AM  Kyle Kidd  has presented today for surgery, with the diagnosis of osteoarthritis left hip.  The various methods of treatment have been discussed with the patient and family. After consideration of risks, benefits and other options for treatment, the patient has consented to  Procedure(s): LEFT TOTAL HIP ARTHROPLASTY ANTERIOR APPROACH (Left) as a surgical intervention.  The patient's history has been reviewed, patient examined, no change in status, stable for surgery.  I have reviewed the patient's chart and labs.  Questions were answered to the patient's satisfaction.     Kathryne Hitch

## 2023-06-18 NOTE — Op Note (Signed)
Operative Note  Date of operation: 06/18/2023 Preoperative diagnosis: Left hip primary osteoarthritis Postoperative diagnosis: Same  Procedure: Left direct anterior total hip arthroplasty  Implants: Implant Name Type Inv. Item Serial No. Manufacturer Lot No. LRB No. Used Action  LINER NEUTRAL 36X58 PLUS4 - [id number]  LINER NEUTRAL 36X58 PLUS4  DEPUY ORTHOPAEDICS [id number]  1 Implanted  CUP SECTOR GRIPTON - [id number] Orthopedic Implant CUP SECTOR GRIPTON  DEPUY ORTHOPAEDICS [id number] Left 1 Implanted  LINER NEUTRAL 36X58 PLUS4 - [id number]  LINER NEUTRAL 36X58 PLUS4  DEPUY ORTHOPAEDICS [id number] Left 1 Implanted  CORAIL OFFSET COLLAR SZ 11 HIP - [id number] Stem CORAIL OFFSET COLLAR SZ 11 HIP  DEPUY ORTHOPAEDICS [id number] Left 1 Implanted   Surgeon: Vanita Panda. Magnus Ivan, MD Assistant: Youlanda Roys, RNFA  Anesthesia: Spinal EBL: 200 cc Antibiotics: 2 g IV Ancef Complications: None  Indications: The patient is a 78 year old gentleman with severe arthritis involving his left hip this been well-documented with x-ray findings and clinical exam findings.  Several years ago we did replace his right hip for several reasons.  That hip is done very well for him.  Now his left hip pain has become debilitating and is definitely affecting his mobility, his quality of life, and his actives daily living.  He wished to proceed with a hip replaced on the left side and we agree with this as well.  Having had this before he is fully aware of the risk of acute blood loss anemia, nerve vessel injury, fracture, infection, DVT, dislocation, implant failure, leg length differences and wound healing issues.  He understands her goals are hopefully decrease pain, improve mobility, and improve quality of life.  Procedure description: After informed consent was obtained and the appropriate left hip was marked, the patient was brought to the operating room and set up on the stretcher where spinal anesthesia was  obtained.  He was laid in supine position on the stretcher and a Foley catheter was placed.  Traction boots were placed on both his feet and he was placed supine on the Hana fracture table with a perineal post in place in both legs and inline skeletal traction vices but no traction applied.  His left operative hip was prepped and draped with DuraPrep and sterile drapes.  A timeout was called he identifies correct patient correct left hip.  An incision was then made just inferior and posterior the ASIS and carried slightly obliquely down the leg.  Dissection was carried down to the tensor fascia lata muscle and the tensor fascia was then divided longitudinally to proceed with a direct interposed the hip.  Circumflex vessels were identified and cauterized.  The hip capsule was identified and opened up in L-type format finding a moderate joint effusion.  Cobra retractors were placed around the medial and lateral femoral neck and a femoral neck cut was made with an oscillating saw just proximal to the lesser trochanter and this Was completed with an osteotome.  A corkscrew guide was placed in the femoral head and the femoral head was removed in its entirety and it was large and had a wide area devoid of cartilage.  A bent Hohmann was then placed over the medial acetabular rim and remnants of the acetabular labrum and other debris removed.  Reaming was then initiated under direct visualization from a size 43 reamer and stepwise increments going up to a size 57 reamer with all reamers placed under direct visualization and the last reamer also placed under direct fluoroscopy in order to  obtain the depth of reaming, the inclination and anteversion.  The real DePuy sector GRIPTION acetabular pendant size 58 was then placed without difficulty followed by a 36+4 polythene liner.  Attention was then turned to the femur.  With the left leg externally rotated to 120 degrees, extended and adducted, a Mueller retractor is placed  medially and a Hohmann retractor was placed behind the greater trochanter.  A box cutting osteotome was then used to enter the femoral canal and the lateral joint capsule was released.  Broaching was then initiated using the Corail broaching system from a size 8 going to a size 11.  With a size 11 and placed we trialed a standard offset femoral neck and a 36+1.5 trial hip ball.  The leg was brought over and up and with traction and internal rotation reduced in the pelvis.  Based on clinical and mechanical assessment as well as radiographic assessment we felt like we needed a little more leg length and offset.  The hip was dislocated and remove the trial components.  We then placed the real femoral component size 11 but 1 with high offset and then a 36+5 metal head ball.  Again this was reduced into the pelvis we assessed it mechanically and radiographically we are pleased with range of motion, stability, offset and leg length.  The soft tissue was then irrigated normal saline solution.  The joint capsule was closed with interrupted #1 Ethibond suture followed by #1 Vicryl to close the tensor fascia.  0 Vicryl was used to close deep tissue and 2-0 Vicryl was used to close the subcutaneous tissue.  Skin was closed with staples.  An Aquacel dressing was applied.  The patient was taken off on table taken recovery in stable condition.

## 2023-06-18 NOTE — Evaluation (Signed)
Physical Therapy Evaluation Patient Details Name: Kyle Kidd MRN: [id number] DOB: 01-07-1945 Today's Date: 06/18/2023  History of Present Illness  78 y.o. male presents to [facility] hospital on 06/18/2023 for elective L THA. PMH includes skin cancer, HTN, HLD, CKD, afib.  Clinical Impression  Pt presents to PT with deficits in strength, power, sensation, gait, balance. Pt reports some continued numbness in LLE, affecting gait quality during session as pt tends to excessively abduct LLE. Pt will benefit from continued frequent gait training in an effort to improve stability and reduce falls risk. PT provided education on surgical hip exercise packet. PT anticipates the pt will progress well.       Recommendations for follow up therapy are one component of a multi-disciplinary discharge planning process, led by the attending physician.  Recommendations may be updated based on patient status, additional functional criteria and insurance authorization.  Follow Up Recommendations       Assistance Recommended at Discharge PRN  Patient can return home with the following  A little help with bathing/dressing/bathroom;Assistance with cooking/housework;Assist for transportation;Help with stairs or ramp for entrance    Equipment Recommendations None recommended by PT  Recommendations for Other Services       Functional Status Assessment Patient has had a recent decline in their functional status and demonstrates the ability to make significant improvements in function in a reasonable and predictable amount of time.     Precautions / Restrictions Precautions Precautions: Fall Precaution Comments: direct anterior THA Restrictions Weight Bearing Restrictions: Yes LLE Weight Bearing: Weight bearing as tolerated      Mobility  Bed Mobility Overal bed mobility: Needs Assistance Bed Mobility: Supine to Sit, Sit to Supine     Supine to sit: Supervision Sit to supine: Min guard         Transfers Overall transfer level: Needs assistance Equipment used: Rolling walker (2 wheels) Transfers: Sit to/from Stand Sit to Stand: Min guard                Ambulation/Gait Ambulation/Gait assistance: Editor, commissioning (Feet): 50 Feet Assistive device: Rolling walker (2 wheels) Gait Pattern/deviations: Step-through pattern, Wide base of support Gait velocity: reduced Gait velocity interpretation: <1.8 ft/sec, indicate of risk for recurrent falls   General Gait Details: pt with excess L hip abduction. PT anticipates this is related to impaired sensation without ful recovery from nerve block  Stairs            Wheelchair Mobility    Modified Rankin (Stroke Patients Only)       Balance Overall balance assessment: Needs assistance Sitting-balance support: No upper extremity supported, Feet supported Sitting balance-Leahy Scale: Good     Standing balance support: Bilateral upper extremity supported, Reliant on assistive device for balance Standing balance-Leahy Scale: Poor                               Pertinent Vitals/Pain Pain Assessment Pain Assessment: 0-10 Pain Score: 8  Pain Location: L hip Pain Descriptors / Indicators: Sore Pain Intervention(s): Monitored during session    Home Living Family/patient expects to be discharged to:: Private residence Living Arrangements: Alone Available Help at Discharge: Friend(s);Available PRN/intermittently Type of Home: House Home Access: Stairs to enter Entrance Stairs-Rails: None Entrance Stairs-Number of Steps: 3   Home Layout: One level Home Equipment: Agricultural consultant (2 wheels);BSC/3in1      Prior Function Prior Level of Function : Independent/Modified Independent;Working/employed;Driving  Mobility Comments: still works at a [employer]        Extremity/Trunk Assessment   Upper Extremity Assessment Upper Extremity Assessment:  Overall WFL for tasks assessed    Lower Extremity Assessment Lower Extremity Assessment: LLE deficits/detail LLE Deficits / Details: generalized post-op weakness LLE Sensation: decreased light touch    Cervical / Trunk Assessment Cervical / Trunk Assessment: Normal  Communication   Communication: No difficulties  Cognition Arousal/Alertness: Awake/alert Behavior During Therapy: WFL for tasks assessed/performed Overall Cognitive Status: Within Functional Limits for tasks assessed                                          General Comments General comments (skin integrity, edema, etc.): VSS on RA    Exercises Other Exercises Other Exercises: PT provides education on surgical hip exercise packet   Assessment/Plan    PT Assessment Patient needs continued PT services  PT Problem List Decreased strength;Decreased activity tolerance;Decreased balance;Decreased mobility;Decreased knowledge of use of DME;Impaired sensation;Pain       PT Treatment Interventions DME instruction;Gait training;Stair training;Functional mobility training;Therapeutic activities;Therapeutic exercise;Balance training;Neuromuscular re-education;Patient/family education    PT Goals (Current goals can be found in the Care Plan section)  Acute Rehab PT Goals Patient Stated Goal: to return to independence PT Goal Formulation: With patient Time For Goal Achievement: 06/22/23 Potential to Achieve Goals: Good    Frequency 7X/week     Co-evaluation               AM-PAC PT "6 Clicks" Mobility  Outcome Measure Help needed turning from your back to your side while in a flat bed without using bedrails?: A Little Help needed moving from lying on your back to sitting on the side of a flat bed without using bedrails?: A Little Help needed moving to and from a bed to a chair (including a wheelchair)?: A Little Help needed standing up from a chair using your arms (e.g., wheelchair or bedside  chair)?: A Little Help needed to walk in hospital room?: A Little Help needed climbing 3-5 steps with a railing? : A Lot 6 Click Score: 17    End of Session   Activity Tolerance: Patient tolerated treatment well Patient left: in bed;with call bell/phone within reach;with bed alarm set Nurse Communication: Mobility status PT Visit Diagnosis: Other abnormalities of gait and mobility (R26.89);Muscle weakness (generalized) (M62.81);Other symptoms and signs involving the nervous system (R29.898)    Time: 4098-1191 PT Time Calculation (min) (ACUTE ONLY): 20 min   Charges:   PT Evaluation $PT Eval Low Complexity: 1 Low          Arlyss Gandy, PT, DPT Acute Rehabilitation Office [phone]   Arlyss Gandy 06/18/2023, 5:42 PM

## 2023-06-18 NOTE — Transfer of Care (Signed)
Immediate Anesthesia Transfer of Care Note  Patient: Nelda Marseille  Procedure(s) Performed: LEFT TOTAL HIP ARTHROPLASTY ANTERIOR APPROACH (Left: Hip)  Patient Location: PACU  Anesthesia Type:Spinal  Level of Consciousness: awake, alert , and patient cooperative  Airway & Oxygen Therapy: Patient Spontanous Breathing  Post-op Assessment: Report given to RN and Post -op Vital signs reviewed and stable  Post vital signs: Reviewed and stable  Last Vitals:  Vitals Value Taken Time  BP    Temp    Pulse 59 06/18/23 1244  Resp 13 06/18/23 1244  SpO2 97 % 06/18/23 1244  Vitals shown include unvalidated device data.  Last Pain:  Vitals:   06/18/23 1027  TempSrc:   PainSc: 0-No pain         Complications: No notable events documented.

## 2023-06-18 NOTE — Anesthesia Postprocedure Evaluation (Signed)
Anesthesia Post Note  Patient: Kyle Kidd  Procedure(s) Performed: LEFT TOTAL HIP ARTHROPLASTY ANTERIOR APPROACH (Left: Hip)     Patient location during evaluation: PACU Anesthesia Type: Spinal Level of consciousness: oriented and awake and alert Pain management: pain level controlled Vital Signs Assessment: post-procedure vital signs reviewed and stable Respiratory status: spontaneous breathing, respiratory function stable and nonlabored ventilation Cardiovascular status: blood pressure returned to baseline and stable Postop Assessment: no headache, no backache, no apparent nausea or vomiting and spinal receding Anesthetic complications: no   No notable events documented.  Last Vitals:  Vitals:   06/18/23 1315 06/18/23 1330  BP: 107/75 120/78  Pulse: 60 (!) 57  Resp: 15 12  Temp:    SpO2: 93% 94%    Last Pain:  Vitals:   06/18/23 1315  TempSrc:   PainSc: 0-No pain                 Arlen Legendre A.

## 2023-06-18 NOTE — Plan of Care (Signed)
  Problem: Activity: Goal: Ability to tolerate increased activity will improve Outcome: Progressing   

## 2023-06-19 DIAGNOSIS — M25552 Pain in left hip: Secondary | ICD-10-CM | POA: Diagnosis present

## 2023-06-19 DIAGNOSIS — Z8616 Personal history of COVID-19: Secondary | ICD-10-CM | POA: Diagnosis not present

## 2023-06-19 DIAGNOSIS — I480 Paroxysmal atrial fibrillation: Secondary | ICD-10-CM | POA: Diagnosis present

## 2023-06-19 DIAGNOSIS — I10 Essential (primary) hypertension: Secondary | ICD-10-CM | POA: Diagnosis present

## 2023-06-19 DIAGNOSIS — Z79899 Other long term (current) drug therapy: Secondary | ICD-10-CM | POA: Diagnosis not present

## 2023-06-19 DIAGNOSIS — Z85828 Personal history of other malignant neoplasm of skin: Secondary | ICD-10-CM | POA: Diagnosis not present

## 2023-06-19 DIAGNOSIS — Z888 Allergy status to other drugs, medicaments and biological substances status: Secondary | ICD-10-CM | POA: Diagnosis not present

## 2023-06-19 DIAGNOSIS — Z96641 Presence of right artificial hip joint: Secondary | ICD-10-CM | POA: Diagnosis present

## 2023-06-19 DIAGNOSIS — M1612 Unilateral primary osteoarthritis, left hip: Secondary | ICD-10-CM | POA: Diagnosis present

## 2023-06-19 DIAGNOSIS — E785 Hyperlipidemia, unspecified: Secondary | ICD-10-CM | POA: Diagnosis present

## 2023-06-19 LAB — CBC
HCT: 39.9 % (ref 39.0–52.0)
Hemoglobin: 13.3 g/dL (ref 13.0–17.0)
MCH: 33.8 pg (ref 26.0–34.0)
MCHC: 33.3 g/dL (ref 30.0–36.0)
MCV: 101.5 fL — ABNORMAL HIGH (ref 80.0–100.0)
Platelets: 239 10*3/uL (ref 150–400)
RBC: 3.93 MIL/uL — ABNORMAL LOW (ref 4.22–5.81)
RDW: 13.7 % (ref 11.5–15.5)
WBC: 9.9 10*3/uL (ref 4.0–10.5)
nRBC: 0 % (ref 0.0–0.2)

## 2023-06-19 LAB — BASIC METABOLIC PANEL
Anion gap: 7 (ref 5–15)
BUN: 17 mg/dL (ref 8–23)
CO2: 23 mmol/L (ref 22–32)
Calcium: 8.5 mg/dL — ABNORMAL LOW (ref 8.9–10.3)
Chloride: 103 mmol/L (ref 98–111)
Creatinine, Ser: 1.43 mg/dL — ABNORMAL HIGH (ref 0.61–1.24)
GFR, Estimated: 50 mL/min — ABNORMAL LOW (ref >60)
Glucose, Bld: 155 mg/dL — ABNORMAL HIGH (ref 70–99)
Potassium: 4.3 mmol/L (ref 3.5–5.1)
Sodium: 133 mmol/L — ABNORMAL LOW (ref 135–145)

## 2023-06-19 MED ORDER — ASPIRIN 81 MG PO CHEW: 81.0000 mg | CHEWABLE_TABLET | Freq: Two times a day (BID) | ORAL | 0 refills | Status: AC

## 2023-06-19 MED ORDER — OXYCODONE HCL 5 MG PO TABS: 5.0000 mg | ORAL_TABLET | Freq: Four times a day (QID) | ORAL | 0 refills | Status: AC | PRN

## 2023-06-19 NOTE — Progress Notes (Signed)
Foley out at 0530

## 2023-06-19 NOTE — Progress Notes (Signed)
Subjective: 1 Day Post-Op Procedure(s) (LRB): LEFT TOTAL HIP ARTHROPLASTY ANTERIOR APPROACH (Left) Patient reports pain as moderate.    Objective: Vital signs in last 24 hours: Temp:  [97.5 F (36.4 C)-98.6 F (37 C)] 98.5 F (36.9 C) (06/19 0512) Pulse Rate:  [53-69] 66 (06/19 0512) Resp:  [12-20] 17 (06/19 0512) BP: (87-160)/(50-104) 120/70 (06/19 0512) SpO2:  [93 %-99 %] 97 % (06/19 0512) Weight:  [81.6 kg-83.4 kg] 83.4 kg (06/18 1514)  Intake/Output from previous day: 06/18 0701 - 06/19 0700 In: 2622.1 [P.O.:200; I.V.:2222.1; IV Piggyback:200] Out: 1525 [Urine:1325; Blood:200] Intake/Output this shift: No intake/output data recorded.  Recent Labs    06/19/23 0059  HGB 13.3   Recent Labs    06/19/23 0059  WBC 9.9  RBC 3.93*  HCT 39.9  PLT 239   Recent Labs    06/19/23 0059  NA 133*  K 4.3  CL 103  CO2 23  BUN 17  CREATININE 1.43*  GLUCOSE 155*  CALCIUM 8.5*   No results for input(s): "LABPT", "INR" in the last 72 hours.  Sensation intact distally Intact pulses distally Dorsiflexion/Plantar flexion intact Incision: dressing C/D/I   Assessment/Plan: 1 Day Post-Op Procedure(s) (LRB): LEFT TOTAL HIP ARTHROPLASTY ANTERIOR APPROACH (Left) Up with therapy Discharge home with home health this afternoon if clears PT.      Kathryne Hitch 06/19/2023, 7:25 AM

## 2023-06-19 NOTE — Progress Notes (Signed)
Physical Therapy Treatment Patient Details Name: Kyle Kidd MRN: [id number] DOB: 12/26/1945 Today's Date: 06/19/2023   History of Present Illness 78 y.o. male presents to [facility] on 06/18/2023 for elective L THA. PMH includes skin cancer, HTN, HLD, CKD, afib.    PT Comments    Pt received sitting in the recliner and agreeable to session. Pt reporting improved pain and stiffness this session. Pt able to tolerate increased gait distance and a stair trial. Pt demonstrating improved pacing  and sequencing during gait trial, however continues to abduct and externally rotate LLE during swing through with slight improvement with cues. Pt also abducts LLE during LAQ exercise sitting in the recliner. Pt demonstrates increased difficulty performing towel squeeze exercise due to pain and weakness. Pt encouraged to perform THA HEP independently to increase strength. Pt instructed in stair technique with RW and is able to demo back with min A for balance and blocking the RW. Pt would benefit from additional stair and gait training before discharge to ensure safety at home. Pt continues to benefit from PT services to progress toward functional mobility goals.     Recommendations for follow up therapy are one component of a multi-disciplinary discharge planning process, led by the attending physician.  Recommendations may be updated based on patient status, additional functional criteria and insurance authorization.     Assistance Recommended at Discharge PRN  Patient can return home with the following A little help with bathing/dressing/bathroom;Assistance with cooking/housework;Assist for transportation;Help with stairs or ramp for entrance   Equipment Recommendations  None recommended by PT    Recommendations for Other Services       Precautions / Restrictions Precautions Precautions: Fall Precaution Comments: direct anterior THA Restrictions Weight Bearing Restrictions: Yes LLE Weight  Bearing: Weight bearing as tolerated     Mobility  Bed Mobility Overal bed mobility: Needs Assistance Bed Mobility: Sit to Supine     Supine to sit: Min assist Sit to supine: Min guard   General bed mobility comments: Pt able to use gait belt to elevate LLE to EOB. Cues for use of bedrails to scoot towards [facility]    Transfers Overall transfer level: Needs assistance Equipment used: Rolling walker (2 wheels) Transfers: Sit to/from Stand Sit to Stand: Min guard           General transfer comment: From EOB x1 and recliner x3 with cues for hand placement and min guard for safety. Cues to reach back to chair arms before sitting due to pt demonstrating decreased eccentric control.    Ambulation/Gait Ambulation/Gait assistance: Min guard Gait Distance (Feet): 175 Feet Assistive device: Rolling walker (2 wheels) Gait Pattern/deviations: Step-through pattern, Wide base of support, Trunk flexed, Decreased stance time - left, Decreased step length - right Gait velocity: reduced     General Gait Details: Pt demonstrating improved pacing and L knee flexion during swing phase. Pt continuing to externally rotate LLE, but is able to improve with cues. Cues for upright posture with pt briefly improving.   Stairs Stairs: Yes Stairs assistance: Min assist Stair Management: With walker, Backwards Number of Stairs: 3 General stair comments: Pt instructed on stair technique with RW support. Pt able to perform with min A for balance and to block RW. Pt rushing at times causing increased instability, but no LOB. Cues for pacing and sequencing.     Balance Overall balance assessment: Needs assistance Sitting-balance support: No upper extremity supported, Feet supported Sitting balance-Leahy Scale: Good Sitting balance - Comments: sitting EOB  Standing balance support: Bilateral upper extremity supported, Reliant on assistive device for balance, During functional activity Standing  balance-Leahy Scale: Fair Standing balance comment: with RW support                            Cognition Arousal/Alertness: Awake/alert Behavior During Therapy: WFL for tasks assessed/performed Overall Cognitive Status: Within Functional Limits for tasks assessed                                          Exercises Total Joint Exercises Ankle Circles/Pumps: AROM, 5 reps, Seated Quad Sets: AROM, Seated, Both, 5 reps Towel Squeeze: AROM, Seated, Both, 5 reps Heel Slides: AROM, Seated, Left, 5 reps Long Arc Quad: AROM, Left, Seated, 5 reps Marching in Standing: AROM, Standing, Both, 10 reps    General Comments        Pertinent Vitals/Pain Pain Assessment Pain Assessment: 0-10 Pain Score: 3  Pain Location: L hip Pain Descriptors / Indicators: Sore Pain Intervention(s): Monitored during session, Repositioned     PT Goals (current goals can now be found in the care plan section) Acute Rehab PT Goals Patient Stated Goal: to return to independence PT Goal Formulation: With patient Time For Goal Achievement: 06/22/23 Potential to Achieve Goals: Good Progress towards PT goals: Progressing toward goals    Frequency    7X/week      PT Plan Current plan remains appropriate       AM-PAC PT "6 Clicks" Mobility   Outcome Measure  Help needed turning from your back to your side while in a flat bed without using bedrails?: None Help needed moving from lying on your back to sitting on the side of a flat bed without using bedrails?: A Little Help needed moving to and from a bed to a chair (including a wheelchair)?: A Little Help needed standing up from a chair using your arms (e.g., wheelchair or bedside chair)?: A Little Help needed to walk in hospital room?: A Little Help needed climbing 3-5 steps with a railing? : A Little 6 Click Score: 19    End of Session Equipment Utilized During Treatment: Gait belt Activity Tolerance: Patient tolerated  treatment well Patient left: in bed;with call bell/phone within reach Nurse Communication: Mobility status PT Visit Diagnosis: Other abnormalities of gait and mobility (R26.89);Muscle weakness (generalized) (M62.81);Other symptoms and signs involving the nervous system (R29.898)     Time: 1610-9604 PT Time Calculation (min) (ACUTE ONLY): 33 min  Charges:  $Gait Training: 8-22 mins $Therapeutic Exercise: 8-22 mins                     Johny Shock, PTA Acute Rehabilitation Services Secure Chat Preferred  [phone]    Johny Shock 06/19/2023, 2:10 PM

## 2023-06-19 NOTE — Care Management Obs Status (Signed)
MEDICARE OBSERVATION STATUS NOTIFICATION   Patient Details  Name: Kyle Kidd MRN: [id number] Date of Birth: Jun 25, 1945   Medicare Observation Status Notification Given:  Yes    Lawerance Sabal, RN 06/19/2023, 10:02 AM

## 2023-06-19 NOTE — Discharge Instructions (Signed)

## 2023-06-19 NOTE — Progress Notes (Signed)
Physical Therapy Treatment Patient Details Name: Kyle Kidd MRN: [id number] DOB: [birth year] Today's Date: 06/19/2023   History of Present Illness 78 y.o. male presents to [facility] hospital on 06/18/2023 for elective L THA. PMH includes skin cancer, HTN, HLD, CKD, afib.    PT Comments    Pt received in supine and agreeable to session. Pt demonstrating increased difficulty with LLE management during bed mobility this session. Pt instructed in use of gait belt, however still requiring min A. Pt able to stand from EOB and perform gait trial with min guard for safety. Pt initially demonstrating difficulty with gait pattern and pain with a very wide BOS and RLE behind the RW. Pt instructed in gait sequence and technique with pt demonstrating improvement. Pt limited by LLE pain and fatigue this session. Plan for progressed gait and stair trial as able next session. Pt continues to benefit from PT services to progress toward functional mobility goals.     Recommendations for follow up therapy are one component of a multi-disciplinary discharge planning process, led by the attending physician.  Recommendations may be updated based on patient status, additional functional criteria and insurance authorization.     Assistance Recommended at Discharge PRN  Patient can return home with the following A little help with bathing/dressing/bathroom;Assistance with cooking/housework;Assist for transportation;Help with stairs or ramp for entrance   Equipment Recommendations  None recommended by PT    Recommendations for Other Services       Precautions / Restrictions Precautions Precautions: Fall Precaution Comments: direct anterior THA Restrictions Weight Bearing Restrictions: Yes LLE Weight Bearing: Weight bearing as tolerated     Mobility  Bed Mobility Overal bed mobility: Needs Assistance Bed Mobility: Supine to Sit     Supine to sit: Min assist     General bed mobility comments: Pt with  difficulty advancing LLE to EOB. Pt instructed in use of gait belt to manage LLE, but pt requiring min A    Transfers Overall transfer level: Needs assistance Equipment used: Rolling walker (2 wheels) Transfers: Sit to/from Stand Sit to Stand: Min guard           General transfer comment: from EOB with cues for hand placement    Ambulation/Gait Ambulation/Gait assistance: Min guard Gait Distance (Feet): 40 Feet Assistive device: Rolling walker (2 wheels) Gait Pattern/deviations: Step-through pattern, Wide base of support, Trunk flexed, Decreased stance time - left, Decreased step length - right Gait velocity: reduced     General Gait Details: Pt initially demonstrating a quick pace with RLE abducted behind the RW. Pt improving with cues for pacing and technique. Pt continuing to demonstrate a wide BOS, but improved with progressed distance and cues for L knee flexion during swing through to reduce abduction. Cues for upright posture.      Balance Overall balance assessment: Needs assistance Sitting-balance support: No upper extremity supported, Feet supported Sitting balance-Leahy Scale: Good Sitting balance - Comments: sitting EOB   Standing balance support: Bilateral upper extremity supported, Reliant on assistive device for balance Standing balance-Leahy Scale: Poor Standing balance comment: with RW support                            Cognition Arousal/Alertness: Awake/alert Behavior During Therapy: WFL for tasks assessed/performed Overall Cognitive Status: Within Functional Limits for tasks assessed  Exercises      General Comments        Pertinent Vitals/Pain Pain Assessment Pain Assessment: 0-10 Pain Score: 3  Pain Location: L hip Pain Descriptors / Indicators: Sore Pain Intervention(s): Monitored during session, Repositioned     PT Goals (current goals can now be found in the care  plan section) Acute Rehab PT Goals Patient Stated Goal: to return to independence PT Goal Formulation: With patient Time For Goal Achievement: 06/22/23 Potential to Achieve Goals: Good Progress towards PT goals: Progressing toward goals    Frequency    7X/week      PT Plan Current plan remains appropriate       AM-PAC PT "6 Clicks" Mobility   Outcome Measure  Help needed turning from your back to your side while in a flat bed without using bedrails?: A Little Help needed moving from lying on your back to sitting on the side of a flat bed without using bedrails?: A Little Help needed moving to and from a bed to a chair (including a wheelchair)?: A Little Help needed standing up from a chair using your arms (e.g., wheelchair or bedside chair)?: A Little Help needed to walk in hospital room?: A Little Help needed climbing 3-5 steps with a railing? : A Lot 6 Click Score: 17    End of Session Equipment Utilized During Treatment: Gait belt Activity Tolerance: Patient tolerated treatment well Patient left: with call bell/phone within reach;in chair Nurse Communication: Mobility status PT Visit Diagnosis: Other abnormalities of gait and mobility (R26.89);Muscle weakness (generalized) (M62.81);Other symptoms and signs involving the nervous system (Z61.096)     Time: 0454-0981 PT Time Calculation (min) (ACUTE ONLY): 21 min  Charges:  $Gait Training: 8-22 mins                     Johny Shock, PTA Acute Rehabilitation Services Secure Chat Preferred  [phone]    Johny Shock 06/19/2023, 9:39 AM

## 2023-06-19 NOTE — TOC Transition Note (Signed)
Transition of Care [facility]) - CM/SW Discharge Note   Patient Details  Name: Kyle Kidd MRN: [id number] Date of Birth: [birth year]  [facility] CM/SW Contact:  Lawerance Sabal, RN Phone Number: 06/19/2023, 9:59 AM   Clinical Narrative:     Spoke w patient at bedside.  He states that he has RW and 3/1 at home.  Notified by [facility] agency liaison that referral was made pre op by office for [facility] services. Added agency to AVS, patient aware.      Barriers to Discharge: No Barriers Identified   Patient Goals and CMS Choice CMS Medicare.gov Compare Post Acute Care list provided to:: Patient Choice offered to / list presented to : Patient  Discharge Placement                         Discharge Plan and Services Additional resources added to the After Visit Summary for     Discharge Planning Services: CM Consult Post Acute Care Choice: Home Health          DME Arranged: N/A         HH Arranged: PT HH Agency: [facility] Date [facility] Agency Contacted: 06/18/23   Representative spoke with at [facility] Agency: [place]  Social Determinants of Health (SDOH) Interventions SDOH Screenings   Food Insecurity: No Food Insecurity (06/18/2023)  Housing: Patient Declined (06/18/2023)  Transportation Needs: No Transportation Needs (06/18/2023)  Utilities: Not At Risk (06/18/2023)  Tobacco Use: Low Risk  (06/18/2023)     Readmission Risk Interventions     No data to display

## 2023-06-19 NOTE — Progress Notes (Signed)
Patient ID: Kyle Kidd, male   DOB: [birth year], 78 y.o.   MRN: [id number] The patient is currently at the bedside chair.  I did review the notes from physical therapy this morning.  They will work with him again this afternoon but it really sounds like he needs to stay 1 more day.  He does live alone but does have support at home.  However, given his struggles with mobility today, it is safer to keep him here for 1 more day with hopeful discharge tomorrow.  I will switch him to an inpatient admission given that fact.

## 2023-06-20 MED ORDER — SODIUM CHLORIDE 0.9 % IV BOLUS
500.0000 mL | Freq: Once | INTRAVENOUS | Status: AC
  Administered 2023-06-20: 500 mL via INTRAVENOUS

## 2023-06-20 NOTE — Progress Notes (Signed)
Physical Therapy Treatment Patient Details Name: Kyle Kidd MRN: [id number] DOB: [birth year] Today's Date: 06/20/2023   History of Present Illness 78 y.o. male presents to [facility] hospital on 06/18/2023 for elective L THA. PMH includes skin cancer, HTN, HLD, CKD, afib.    PT Comments    Pt received in supine and agreeable to session. Pt reports not performing HEP and states that he cannot perform heel slides with LLE due to pain and weakness. Pt able to perform gait trial with focus on upright posture and decreased LLE abduction with pt able to slightly improve. Pt limited by pain and fatigue this session. Once sitting in the recliner, pt closed eyes and did not respond to cues for ~5 seconds and then began responding again. BLE were elevated and BP noted to be 88/60. Pt able to participate in seated exercises and was educated on benefits of performing independently. Pt requires assist to perform heel slides due to LLE external rotation despite cues. Pt demonstrating difficulty coordinating movement requiring increased cues for technique. Pt continues to benefit from PT services to progress toward functional mobility goals.     Recommendations for follow up therapy are one component of a multi-disciplinary discharge planning process, led by the attending physician.  Recommendations may be updated based on patient status, additional functional criteria and insurance authorization.     Assistance Recommended at Discharge PRN  Patient can return home with the following A little help with bathing/dressing/bathroom;Assistance with cooking/housework;Assist for transportation;Help with stairs or ramp for entrance   Equipment Recommendations  None recommended by PT    Recommendations for Other Services       Precautions / Restrictions Precautions Precautions: Fall Precaution Comments: direct anterior THA Restrictions Weight Bearing Restrictions: Yes LLE Weight Bearing: Weight bearing as  tolerated     Mobility  Bed Mobility Overal bed mobility: Needs Assistance Bed Mobility: Supine to Sit     Supine to sit: Min guard     General bed mobility comments: Cues for proper use of gait belt to manage LLE with pt still demonstrating some difficulty coordinating.    Transfers Overall transfer level: Needs assistance Equipment used: Rolling walker (2 wheels) Transfers: Sit to/from Stand Sit to Stand: Min assist           General transfer comment: from low EOB with min A for balance and power up due to increased pain and stiffness    Ambulation/Gait Ambulation/Gait assistance: Min guard Gait Distance (Feet): 100 Feet Assistive device: Rolling walker (2 wheels) Gait Pattern/deviations: Step-through pattern, Wide base of support, Trunk flexed, Decreased stance time - left, Decreased step length - right Gait velocity: reduced     General Gait Details: Pt continuing to demonstrate some LLE abduction requiring cues to narrow BOS. Pt with L lateral lean during LLE WB.       Balance Overall balance assessment: Needs assistance Sitting-balance support: No upper extremity supported, Feet supported Sitting balance-Leahy Scale: Good Sitting balance - Comments: sitting EOB   Standing balance support: Bilateral upper extremity supported, Reliant on assistive device for balance, During functional activity Standing balance-Leahy Scale: Fair Standing balance comment: with RW support                            Cognition Arousal/Alertness: Awake/alert Behavior During Therapy: WFL for tasks assessed/performed Overall Cognitive Status: Within Functional Limits for tasks assessed  Exercises Total Joint Exercises Quad Sets: AROM, Seated, Both, 5 reps Towel Squeeze: AROM, Seated, Both, 5 reps Heel Slides: Seated, Left, 5 reps, AAROM    General Comments General comments (skin integrity, edema, etc.):  Pt's BP sitting EOB at beginning of session: 131/75; BP sitting in recliner post gait trial: 88/60; BP at end of session in recliner with BLE elevated and back reclined some: 117/62.  After gait trial pt sitting in recliner with eyes closed and not responding for ~5 seconds, then began mumbling and could open his eyes with cues. Pt reports that he could hear therapist during this time.      Pertinent Vitals/Pain Pain Assessment Pain Assessment: Faces Faces Pain Scale: Hurts little more Pain Location: L hip Pain Descriptors / Indicators: Sore, Guarding Pain Intervention(s): Monitored during session, Repositioned     PT Goals (current goals can now be found in the care plan section) Acute Rehab PT Goals Patient Stated Goal: to return to independence PT Goal Formulation: With patient Time For Goal Achievement: 06/22/23 Potential to Achieve Goals: Good Progress towards PT goals: Progressing toward goals    Frequency    7X/week      PT Plan Current plan remains appropriate       AM-PAC PT "6 Clicks" Mobility   Outcome Measure  Help needed turning from your back to your side while in a flat bed without using bedrails?: None Help needed moving from lying on your back to sitting on the side of a flat bed without using bedrails?: A Little Help needed moving to and from a bed to a chair (including a wheelchair)?: A Little Help needed standing up from a chair using your arms (e.g., wheelchair or bedside chair)?: A Little Help needed to walk in hospital room?: A Little Help needed climbing 3-5 steps with a railing? : A Little 6 Click Score: 19    End of Session Equipment Utilized During Treatment: Gait belt Activity Tolerance: Patient tolerated treatment well Patient left: in chair;with call bell/phone within reach Nurse Communication: Mobility status PT Visit Diagnosis: Other abnormalities of gait and mobility (R26.89);Muscle weakness (generalized) (M62.81);Other symptoms and  signs involving the nervous system (R29.898)     Time: 8295-6213 PT Time Calculation (min) (ACUTE ONLY): 37 min  Charges:  $Gait Training: 8-22 mins $Therapeutic Exercise: 8-22 mins                     Johny Shock, PTA Acute Rehabilitation Services Secure Chat Preferred  Office:([phone]    Johny Shock 06/20/2023, 10:31 AM

## 2023-06-20 NOTE — Progress Notes (Signed)
Patient ID: AKON BERTANI, male   DOB: [birth year], 78 y.o.   MRN: [id number] The patient is currently working with physical therapy.  His blood pressure has been a little soft when he is up.  His hemoglobin yesterday was over 13.  His hypotension is likely from orthostasis from just dealing with the surgery and pain medication and mobility for 78 year old.  We have blood pressure medications held.  An IV bolus of normal saline has been ordered.  He would like to be able to go home later this afternoon but I would only allow that if he has done well with PT and is making good progress with his vitals.  Most likely discharge will be tomorrow and he understands as well but a lot will depend on how his afternoon dose.

## 2023-06-20 NOTE — Progress Notes (Signed)
Physical Therapy Treatment Patient Details Name: Kyle Kidd MRN: [id number] DOB: April 06, 1945 Today's Date: 06/20/2023   History of Present Illness 78 y.o. male presents to [facility] hospital on 06/18/2023 for elective L THA. PMH includes skin cancer, HTN, HLD, CKD, afib.    PT Comments    Pt received sitting in the recliner and agreeable to session. Pt noted to have LLE resting in abduction and was educated on importance of maintaining L hip in neutral to prevent stiffness. Pt able to tolerate gait trial with improved steadiness, however continuing to keep LLE in slight abduction. Pt able to perform seated exercises, however demonstrates increased difficulty with adduction due to pain and weakness. Pt able to perform pillow squeeze, however the L is very weak. Pt also continues to externally rotate LLE during heel slides and LAQ. Pt continues to benefit from PT services to progress toward functional mobility goals.     Recommendations for follow up therapy are one component of a multi-disciplinary discharge planning process, led by the attending physician.  Recommendations may be updated based on patient status, additional functional criteria and insurance authorization.     Assistance Recommended at Discharge PRN  Patient can return home with the following A little help with bathing/dressing/bathroom;Assistance with cooking/housework;Assist for transportation;Help with stairs or ramp for entrance   Equipment Recommendations  None recommended by PT    Recommendations for Other Services       Precautions / Restrictions Precautions Precautions: Fall Precaution Comments: direct anterior THA Restrictions Weight Bearing Restrictions: Yes LLE Weight Bearing: Weight bearing as tolerated     Mobility  Bed Mobility Overal bed mobility: Needs Assistance Bed Mobility: Supine to Sit     Supine to sit: Min guard     General bed mobility comments: Pt beginning and ending session in  recliner    Transfers Overall transfer level: Needs assistance Equipment used: Rolling walker (2 wheels) Transfers: Sit to/from Stand Sit to Stand: Supervision           General transfer comment: Pt able to stand from recliner with supervision for safety.    Ambulation/Gait Ambulation/Gait assistance: Min guard, Supervision Gait Distance (Feet): 115 Feet Assistive device: Rolling walker (2 wheels) Gait Pattern/deviations: Step-through pattern, Wide base of support, Trunk flexed, Decreased stance time - left, Decreased step length - right Gait velocity: reduced     General Gait Details: Pt continuing to demonstrate some LLE abduction requiring cues to narrow BOS. Pt with L lateral lean during LLE WB. Cues for upright posture and increased RLE clearance and knee flexion.       Balance Overall balance assessment: Needs assistance Sitting-balance support: No upper extremity supported, Feet supported Sitting balance-Leahy Scale: Good Sitting balance - Comments: sitting EOB   Standing balance support: Bilateral upper extremity supported, Reliant on assistive device for balance, During functional activity Standing balance-Leahy Scale: Fair Standing balance comment: with RW support                            Cognition Arousal/Alertness: Awake/alert Behavior During Therapy: WFL for tasks assessed/performed Overall Cognitive Status: Within Functional Limits for tasks assessed                                          Exercises Total Joint Exercises Ankle Circles/Pumps: AROM, 5 reps, Seated, Both Quad Sets: AROM, Seated, Both,  5 reps Towel Squeeze: AROM, Seated, Both, 5 reps Heel Slides: Seated, Left, 5 reps, AAROM Long Arc Quad: AROM, Left, Seated, 5 reps General Exercises - Lower Extremity Hip Flexion/Marching: Left, AAROM, Seated, 5 reps    General Comments General comments (skin integrity, edema, etc.): BP at beginning of session: 126/66;  BP post gait trial: 131/73      Pertinent Vitals/Pain Pain Assessment Pain Assessment: Faces Faces Pain Scale: Hurts little more Pain Location: L hip Pain Descriptors / Indicators: Sore, Guarding Pain Intervention(s): Monitored during session, Repositioned     PT Goals (current goals can now be found in the care plan section) Acute Rehab PT Goals Patient Stated Goal: to return to independence PT Goal Formulation: With patient Time For Goal Achievement: 06/22/23 Potential to Achieve Goals: Good Progress towards PT goals: Progressing toward goals    Frequency    7X/week      PT Plan Current plan remains appropriate       AM-PAC PT "6 Clicks" Mobility   Outcome Measure  Help needed turning from your back to your side while in a flat bed without using bedrails?: None Help needed moving from lying on your back to sitting on the side of a flat bed without using bedrails?: A Little Help needed moving to and from a bed to a chair (including a wheelchair)?: A Little Help needed standing up from a chair using your arms (e.g., wheelchair or bedside chair)?: A Little Help needed to walk in hospital room?: A Little Help needed climbing 3-5 steps with a railing? : A Little 6 Click Score: 19    End of Session Equipment Utilized During Treatment: Gait belt Activity Tolerance: Patient tolerated treatment well Patient left: in chair;with call bell/phone within reach Nurse Communication: Mobility status PT Visit Diagnosis: Other abnormalities of gait and mobility (R26.89);Muscle weakness (generalized) (M62.81);Other symptoms and signs involving the nervous system (R29.898)     Time: 1610-9604 PT Time Calculation (min) (ACUTE ONLY): 25 min  Charges:  $Gait Training: 8-22 mins $Therapeutic Exercise: 8-22 mins                     Johny Shock, PTA Acute Rehabilitation Services Secure Chat Preferred  [phone]    Johny Shock 06/20/2023, 3:06 PM

## 2023-06-21 NOTE — Progress Notes (Signed)
Physical Therapy Treatment Patient Details Name: Kyle Kidd MRN: [id number] DOB: [birth year] Today's Date: 06/21/2023   History of Present Illness 78 y.o. male presents to [facility] hospital on 06/18/2023 for elective L THA. PMH includes skin cancer, HTN, HLD, CKD, afib.    PT Comments    Pt received in supine and agreeable to session. Pt able to demonstrate improved activity tolerance this session with increased gait distance and a stair trial. Pt demonstrating improved gait kinematics and good recall of stair technique requiring min cues. Pt demonstrating improved ability to perform heel slides with cues for maintaining neutral hip rotation due to pt tending to externally rotate hip. Pt continuing to demonstrate LLE adductor weakness during bed mobility and compensating with other muscle groups and use of the gait belt. Education provided on importance of continuing HEP at home for increased strength. Anticipate pt will be able to manage mobility needs at home.    Recommendations for follow up therapy are one component of a multi-disciplinary discharge planning process, led by the attending physician.  Recommendations may be updated based on patient status, additional functional criteria and insurance authorization.     Assistance Recommended at Discharge PRN  Patient can return home with the following A little help with bathing/dressing/bathroom;Assistance with cooking/housework;Assist for transportation;Help with stairs or ramp for entrance   Equipment Recommendations  None recommended by PT    Recommendations for Other Services       Precautions / Restrictions Precautions Precautions: Fall Precaution Comments: direct anterior THA Restrictions Weight Bearing Restrictions: Yes LLE Weight Bearing: Weight bearing as tolerated     Mobility  Bed Mobility Overal bed mobility: Needs Assistance Bed Mobility: Supine to Sit     Supine to sit: Min guard     General bed mobility  comments: Use of gait belt to manage LLE and bedrails    Transfers Overall transfer level: Needs assistance Equipment used: Rolling walker (2 wheels) Transfers: Sit to/from Stand Sit to Stand: Supervision           General transfer comment: From EOB and recliner with cues for hand placement.    Ambulation/Gait Ambulation/Gait assistance: Supervision Gait Distance (Feet): 130 Feet (x2) Assistive device: Rolling walker (2 wheels) Gait Pattern/deviations: Step-through pattern, Wide base of support, Trunk flexed, Decreased stance time - left Gait velocity: reduced     General Gait Details: Cues for upright posture. Pt's RLE appeared to become more stiff with increased distance.   Stairs Stairs: Yes Stairs assistance: Min guard Stair Management: With walker, Backwards Number of Stairs: 3 General stair comments: Pt demonstrating good recall of stair technique and is able to instruct therapist in assist technique to be able to instruct caregiver. Min guard and assist to block the RW for safety.      Balance Overall balance assessment: Needs assistance Sitting-balance support: No upper extremity supported, Feet supported Sitting balance-Leahy Scale: Good Sitting balance - Comments: sitting EOB   Standing balance support: Bilateral upper extremity supported, Reliant on assistive device for balance, During functional activity Standing balance-Leahy Scale: Fair Standing balance comment: with RW support                            Cognition Arousal/Alertness: Awake/alert Behavior During Therapy: WFL for tasks assessed/performed Overall Cognitive Status: Within Functional Limits for tasks assessed  Exercises Total Joint Exercises Quad Sets: AROM, Seated, Both, 5 reps Heel Slides: Left, 5 reps, AROM, Supine    General Comments        Pertinent Vitals/Pain Pain Assessment Pain Assessment: Faces Faces  Pain Scale: Hurts a little bit Pain Location: L hip Pain Descriptors / Indicators: Sore, Guarding Pain Intervention(s): Monitored during session, Repositioned     PT Goals (current goals can now be found in the care plan section) Acute Rehab PT Goals Patient Stated Goal: to return to independence PT Goal Formulation: With patient Time For Goal Achievement: 06/22/23 Potential to Achieve Goals: Good Progress towards PT goals: Progressing toward goals    Frequency    7X/week      PT Plan Current plan remains appropriate       AM-PAC PT "6 Clicks" Mobility   Outcome Measure  Help needed turning from your back to your side while in a flat bed without using bedrails?: None Help needed moving from lying on your back to sitting on the side of a flat bed without using bedrails?: A Little Help needed moving to and from a bed to a chair (including a wheelchair)?: A Little Help needed standing up from a chair using your arms (e.g., wheelchair or bedside chair)?: A Little Help needed to walk in hospital room?: A Little Help needed climbing 3-5 steps with a railing? : A Little 6 Click Score: 19    End of Session Equipment Utilized During Treatment: Gait belt Activity Tolerance: Patient tolerated treatment well Patient left: in chair;with call bell/phone within reach Nurse Communication: Mobility status PT Visit Diagnosis: Other abnormalities of gait and mobility (R26.89);Muscle weakness (generalized) (M62.81);Other symptoms and signs involving the nervous system (R29.898)     Time: 1610-9604 PT Time Calculation (min) (ACUTE ONLY): 24 min  Charges:  $Gait Training: 8-22 mins $Therapeutic Exercise: 8-22 mins                     Johny Shock, PTA Acute Rehabilitation Services Secure Chat Preferred  [phone]    Johny Shock 06/21/2023, 9:15 AM

## 2023-06-21 NOTE — Discharge Summary (Signed)
Patient ID: Kyle Kidd MRN: [id number] DOB/AGE: [birth year] 78 y.o.  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 No problems or mention of it since.   History of kidney stones    last stone 4-5 yrs ago   Hyperlipidemia    Hypertension    Paroxysmal A-fib (HCC)    in setting of COVID-19 01/2020, no known recurrence (06/12/23)    Surgeries: Procedure(s): LEFT TOTAL HIP ARTHROPLASTY ANTERIOR APPROACH on 06/18/2023   Consultants:   Discharged Condition: Improved  Hospital Course: Kyle Kidd is an 78 y.o. male who was admitted 06/18/2023 for operative treatment ofUnilateral primary osteoarthritis, left hip. Patient has severe unremitting pain that affects sleep, daily activities, and work/hobbies. After pre-op clearance the patient was taken to the operating room on 06/18/2023 and underwent  Procedure(s): LEFT TOTAL HIP ARTHROPLASTY ANTERIOR APPROACH.    Patient was given perioperative antibiotics:  Anti-infectives (From admission, onward)    Start     Dose/Rate Route Frequency Ordered Stop   06/18/23 1700  ceFAZolin (ANCEF) IVPB 1 g/50 mL premix        1 g 100 mL/hr over 30 Minutes Intravenous Every 6 hours 06/18/23 1531 06/18/23 2355   06/18/23 1015  ceFAZolin (ANCEF) IVPB 2g/100 mL premix        2 g 200 mL/hr over 30 Minutes Intravenous On call to O.R. 06/18/23 1003 06/18/23 1128   06/18/23 1004  ceFAZolin (ANCEF) 2-4 GM/100ML-% IVPB       Note to Pharmacy: Lacie Draft A: cabinet override      06/18/23 1004  06/18/23 1101        Patient was given sequential compression devices, early ambulation, and chemoprophylaxis to prevent DVT.  Patient benefited maximally from hospital stay and there were no complications.    Recent vital signs: Patient Vitals for the past 24 hrs:  BP Temp Temp src Pulse Resp SpO2  06/21/23 0844 136/76 98.3 F (36.8 C) -- (!) 102 17 97 %  06/21/23 0836 129/77 97.6 F (36.4 C) -- 71 17 98 %  06/21/23 0442 121/79 98.1 F (36.7 C) Oral 68 18 96 %  06/20/23 2013 99/61 99.2 F (37.3 C) Oral 84 18 98 %  06/20/23 1641 114/74 98.3 F (36.8 C) Oral 72 17 99 %     Recent laboratory studies:  Recent Labs    06/19/23 0059  WBC 9.9  HGB 13.3  HCT 39.9  PLT 239  NA 133*  K 4.3  CL 103  CO2 23  BUN 17  CREATININE 1.43*  GLUCOSE 155*  CALCIUM 8.5*     Discharge Medications:   Allergies as of 06/21/2023       Reactions   Tizanidine Shortness Of Breath   Zanaflex.  Swollen tongue.    Rosuvastatin    Severe muscle aches        Medication List     TAKE these medications    acetaminophen 500 MG tablet Commonly known as: TYLENOL  Take 1,000 mg by mouth every 8 (eight) hours as needed for mild pain or headache.   aspirin 81 MG chewable tablet Chew 1 tablet (81 mg total) by mouth 2 (two) times daily.   losartan 25 MG tablet Commonly known as: COZAAR Take 25 mg by mouth every morning.   oxyCODONE 5 MG immediate release tablet Commonly known as: Oxy IR/ROXICODONE Take 1-2 tablets (5-10 mg total) by mouth every 6 (six) hours as needed for moderate pain (pain score 4-6). No more than 6 tablets per day.               Durable Medical Equipment  (From admission, onward)           Start     Ordered   06/18/23 1532  DME 3 n 1  Once        06/18/23 1531   06/18/23 1532  DME Walker rolling  Once       Question Answer Comment  Walker: With 5 Inch Wheels   Patient needs a walker to treat with the following condition Status post left hip  replacement      06/18/23 1531            Diagnostic Studies: DG HIP UNILAT WITH PELVIS 1V LEFT  Result Date: 06/18/2023 CLINICAL DATA:  Elective surgery. EXAM: DG HIP (WITH OR WITHOUT PELVIS) 1V*L* COMPARISON:  None Available. FINDINGS: Three fluoroscopic spot views of the pelvis and left hip obtained in the operating room. Images during hip arthroplasty. Fluoroscopy time 15 seconds. Dose 1.76 mGy. IMPRESSION: Intraoperative fluoroscopy during left hip arthroplasty. Electronically Signed   By: Narda Rutherford M.D.   On: 06/18/2023 13:36   DG Pelvis Portable  Result Date: 06/18/2023 CLINICAL DATA:  Status post left hip replacement. EXAM: PORTABLE PELVIS 1-2 VIEWS COMPARISON:  None Available. FINDINGS: Left hip arthroplasty in expected alignment. No periprosthetic lucency or fracture. Recent postsurgical change includes air and edema in the soft tissues. Lateral skin staples in place. Previous right hip replacement. IMPRESSION: Left hip arthroplasty without immediate postoperative complication. Electronically Signed   By: Narda Rutherford M.D.   On: 06/18/2023 13:35   DG C-Arm 1-60 Min-No Report  Result Date: 06/18/2023 Fluoroscopy was utilized by the requesting physician.  No radiographic interpretation.    Disposition: Discharge disposition: 01-Home or Self Care          Follow-up Information     Kathryne Hitch, MD Follow up in 2 week(s).   Specialty: Orthopedic Surgery Contact information: [address] [place] [address] [phone]         Jobe Gibbon, [facility] Follow up.   Specialty: Home Health Services Why: for home health services Contact information: [address] [place] [address] [phone]                  Signed: Kathryne Hitch 06/21/2023, 12:49 PM

## 2023-06-21 NOTE — Progress Notes (Signed)
Patient ID: Kyle Kidd, male   DOB: Mar 21, 1945, 78 y.o.   MRN: [id number] The patient is awake and alert this morning.  His vital signs are stable.  His left operative hip is also stable.  He hopes to be able to go home today.  Once he has worked with physical therapy and is doing well, I am fine with him being discharged home this afternoon.

## 2023-06-22 DIAGNOSIS — Z7982 Long term (current) use of aspirin: Secondary | ICD-10-CM | POA: Diagnosis not present

## 2023-06-22 DIAGNOSIS — I480 Paroxysmal atrial fibrillation: Secondary | ICD-10-CM | POA: Diagnosis not present

## 2023-06-22 DIAGNOSIS — I129 Hypertensive chronic kidney disease with stage 1 through stage 4 chronic kidney disease, or unspecified chronic kidney disease: Secondary | ICD-10-CM | POA: Diagnosis not present

## 2023-06-22 DIAGNOSIS — N189 Chronic kidney disease, unspecified: Secondary | ICD-10-CM | POA: Diagnosis not present

## 2023-06-22 DIAGNOSIS — I6521 Occlusion and stenosis of right carotid artery: Secondary | ICD-10-CM | POA: Diagnosis not present

## 2023-06-22 DIAGNOSIS — E785 Hyperlipidemia, unspecified: Secondary | ICD-10-CM | POA: Diagnosis not present

## 2023-06-22 DIAGNOSIS — Z96642 Presence of left artificial hip joint: Secondary | ICD-10-CM | POA: Diagnosis not present

## 2023-06-22 DIAGNOSIS — Z471 Aftercare following joint replacement surgery: Secondary | ICD-10-CM | POA: Diagnosis not present

## 2023-06-22 DIAGNOSIS — Z96641 Presence of right artificial hip joint: Secondary | ICD-10-CM | POA: Diagnosis not present

## 2023-06-24 ENCOUNTER — Telehealth: Payer: Self-pay | Admitting: *Deleted

## 2023-06-24 DIAGNOSIS — Z471 Aftercare following joint replacement surgery: Secondary | ICD-10-CM | POA: Diagnosis not present

## 2023-06-24 DIAGNOSIS — I129 Hypertensive chronic kidney disease with stage 1 through stage 4 chronic kidney disease, or unspecified chronic kidney disease: Secondary | ICD-10-CM | POA: Diagnosis not present

## 2023-06-24 DIAGNOSIS — N189 Chronic kidney disease, unspecified: Secondary | ICD-10-CM | POA: Diagnosis not present

## 2023-06-24 DIAGNOSIS — E785 Hyperlipidemia, unspecified: Secondary | ICD-10-CM | POA: Diagnosis not present

## 2023-06-24 DIAGNOSIS — Z96641 Presence of right artificial hip joint: Secondary | ICD-10-CM | POA: Diagnosis not present

## 2023-06-24 DIAGNOSIS — Z7982 Long term (current) use of aspirin: Secondary | ICD-10-CM | POA: Diagnosis not present

## 2023-06-24 DIAGNOSIS — I6521 Occlusion and stenosis of right carotid artery: Secondary | ICD-10-CM | POA: Diagnosis not present

## 2023-06-24 DIAGNOSIS — Z96642 Presence of left artificial hip joint: Secondary | ICD-10-CM | POA: Diagnosis not present

## 2023-06-24 DIAGNOSIS — I480 Paroxysmal atrial fibrillation: Secondary | ICD-10-CM | POA: Diagnosis not present

## 2023-06-24 NOTE — Telephone Encounter (Signed)
Ortho bundle D/C call completed. 

## 2023-06-26 DIAGNOSIS — E785 Hyperlipidemia, unspecified: Secondary | ICD-10-CM | POA: Diagnosis not present

## 2023-06-26 DIAGNOSIS — I129 Hypertensive chronic kidney disease with stage 1 through stage 4 chronic kidney disease, or unspecified chronic kidney disease: Secondary | ICD-10-CM | POA: Diagnosis not present

## 2023-06-26 DIAGNOSIS — Z7982 Long term (current) use of aspirin: Secondary | ICD-10-CM | POA: Diagnosis not present

## 2023-06-26 DIAGNOSIS — N189 Chronic kidney disease, unspecified: Secondary | ICD-10-CM | POA: Diagnosis not present

## 2023-06-26 DIAGNOSIS — Z96642 Presence of left artificial hip joint: Secondary | ICD-10-CM | POA: Diagnosis not present

## 2023-06-26 DIAGNOSIS — Z471 Aftercare following joint replacement surgery: Secondary | ICD-10-CM | POA: Diagnosis not present

## 2023-06-26 DIAGNOSIS — Z96641 Presence of right artificial hip joint: Secondary | ICD-10-CM | POA: Diagnosis not present

## 2023-06-26 DIAGNOSIS — I480 Paroxysmal atrial fibrillation: Secondary | ICD-10-CM | POA: Diagnosis not present

## 2023-06-26 DIAGNOSIS — I6521 Occlusion and stenosis of right carotid artery: Secondary | ICD-10-CM | POA: Diagnosis not present

## 2023-06-27 ENCOUNTER — Telehealth: Payer: Self-pay | Admitting: *Deleted

## 2023-06-27 DIAGNOSIS — N189 Chronic kidney disease, unspecified: Secondary | ICD-10-CM | POA: Diagnosis not present

## 2023-06-27 DIAGNOSIS — Z7982 Long term (current) use of aspirin: Secondary | ICD-10-CM | POA: Diagnosis not present

## 2023-06-27 DIAGNOSIS — I129 Hypertensive chronic kidney disease with stage 1 through stage 4 chronic kidney disease, or unspecified chronic kidney disease: Secondary | ICD-10-CM | POA: Diagnosis not present

## 2023-06-27 DIAGNOSIS — Z471 Aftercare following joint replacement surgery: Secondary | ICD-10-CM | POA: Diagnosis not present

## 2023-06-27 DIAGNOSIS — I6521 Occlusion and stenosis of right carotid artery: Secondary | ICD-10-CM | POA: Diagnosis not present

## 2023-06-27 DIAGNOSIS — I480 Paroxysmal atrial fibrillation: Secondary | ICD-10-CM | POA: Diagnosis not present

## 2023-06-27 DIAGNOSIS — E785 Hyperlipidemia, unspecified: Secondary | ICD-10-CM | POA: Diagnosis not present

## 2023-06-27 DIAGNOSIS — Z96641 Presence of right artificial hip joint: Secondary | ICD-10-CM | POA: Diagnosis not present

## 2023-06-27 DIAGNOSIS — Z96642 Presence of left artificial hip joint: Secondary | ICD-10-CM | POA: Diagnosis not present

## 2023-06-27 NOTE — Telephone Encounter (Signed)
Ortho bundle 7 day call completed.Spoke with patient who had questions related to swelling. Has seen [facility] therapy several times this week and feels this is going well. Not taking pain medication- states no pain except for knee a little. Explained why knee may be painful. Recommended really elevating for swelling, icing, compression hose. No pain in calf or LE.

## 2023-07-02 ENCOUNTER — Encounter: Payer: Self-pay | Admitting: Physician Assistant

## 2023-07-02 ENCOUNTER — Ambulatory Visit (HOSPITAL_COMMUNITY)
Admission: RE | Admit: 2023-07-02 | Discharge: 2023-07-02 | Disposition: A | Discharge status: Home / Self Care | Payer: Medicare HMO | Source: Ambulatory Visit | Attending: Physician Assistant | Admitting: Physician Assistant

## 2023-07-02 ENCOUNTER — Ambulatory Visit (INDEPENDENT_AMBULATORY_CARE_PROVIDER_SITE_OTHER): Payer: Medicare HMO | Admitting: Physician Assistant

## 2023-07-02 DIAGNOSIS — M7989 Other specified soft tissue disorders: Secondary | ICD-10-CM | POA: Diagnosis not present

## 2023-07-02 DIAGNOSIS — Z96642 Presence of left artificial hip joint: Secondary | ICD-10-CM | POA: Insufficient documentation

## 2023-07-02 NOTE — Progress Notes (Signed)
Left lower extremity venous study completed.   Preliminary results relayed to [place] office.  Please see CV Procedures for preliminary results.  Wilma Wuthrich, RVT  4:05 PM 07/02/23

## 2023-07-02 NOTE — Progress Notes (Signed)
HPI: Mr. Kyle Kidd returns today status post left total hip arthroplasty.  He is ambulating with a cane.  He has pain in his knee and calf.  He ranks his pain to be 3-4 out of 10 no real hip pain.  He is taking Tylenol for pain at this point in time.  He is on aspirin 81 mg twice daily for DVT prophylaxis.  Physical exam: General well-developed well-nourished male no acute distress.  Ambulates with a cane. Left hip surgical incisions healing well no signs of infection.  Left calf mild tenderness.  Does have some swelling in the left calf compared to the right.  Dorsiflexion plantarflexion left ankle intact.  Impression: Status post left total hip arthroplasty 06/18/2023  Plan: He is sent for Doppler to rule out DVT left lower leg.  Doppler was read today and is no evidence of DVT in the lower extremity.  No cystic structures found in the popliteal fossa.  Therefore he will continue on 81 mg aspirin for a week and then discontinue.  He will follow-up with [place] in 1 month sooner if there is any questions concerns.  Scar tissue mobilization encouraged.  Questions were encouraged and answered at length today.

## 2023-07-03 DIAGNOSIS — I6521 Occlusion and stenosis of right carotid artery: Secondary | ICD-10-CM | POA: Diagnosis not present

## 2023-07-03 DIAGNOSIS — E785 Hyperlipidemia, unspecified: Secondary | ICD-10-CM | POA: Diagnosis not present

## 2023-07-03 DIAGNOSIS — Z471 Aftercare following joint replacement surgery: Secondary | ICD-10-CM | POA: Diagnosis not present

## 2023-07-03 DIAGNOSIS — I480 Paroxysmal atrial fibrillation: Secondary | ICD-10-CM | POA: Diagnosis not present

## 2023-07-03 DIAGNOSIS — Z96642 Presence of left artificial hip joint: Secondary | ICD-10-CM | POA: Diagnosis not present

## 2023-07-03 DIAGNOSIS — Z7982 Long term (current) use of aspirin: Secondary | ICD-10-CM | POA: Diagnosis not present

## 2023-07-03 DIAGNOSIS — Z96641 Presence of right artificial hip joint: Secondary | ICD-10-CM | POA: Diagnosis not present

## 2023-07-03 DIAGNOSIS — I129 Hypertensive chronic kidney disease with stage 1 through stage 4 chronic kidney disease, or unspecified chronic kidney disease: Secondary | ICD-10-CM | POA: Diagnosis not present

## 2023-07-03 DIAGNOSIS — N189 Chronic kidney disease, unspecified: Secondary | ICD-10-CM | POA: Diagnosis not present

## 2023-07-10 ENCOUNTER — Telehealth: Payer: Self-pay | Admitting: *Deleted

## 2023-07-10 NOTE — Telephone Encounter (Signed)
Attempted call to patient; left VM requesting call back.

## 2023-07-10 NOTE — Telephone Encounter (Signed)
14 day Post op call completed.

## 2023-07-29 ENCOUNTER — Encounter: Payer: Medicare HMO | Admitting: Orthopaedic Surgery

## 2023-07-31 ENCOUNTER — Ambulatory Visit (INDEPENDENT_AMBULATORY_CARE_PROVIDER_SITE_OTHER): Payer: Medicare HMO | Admitting: Orthopaedic Surgery

## 2023-07-31 ENCOUNTER — Telehealth: Payer: Self-pay | Admitting: *Deleted

## 2023-07-31 DIAGNOSIS — M2170 Unequal limb length (acquired), unspecified site: Secondary | ICD-10-CM

## 2023-07-31 DIAGNOSIS — Z96642 Presence of left artificial hip joint: Secondary | ICD-10-CM

## 2023-07-31 NOTE — Telephone Encounter (Signed)
Ortho bundle in office meeting completed.

## 2023-07-31 NOTE — Progress Notes (Signed)
The patient comes in today is 6 weeks status post a right total hip arthroplasty.  We replaced his left hip several years ago.  He is an active 78 year old gentleman and said he is back to work for about 2 weeks now.  He does feel like there is a leg length difference but overall he is doing well.  When I have him lay supine he is a little shorter on the right side than his more recent left operative hip.  Left hip incision looks good and the left hip moves smoothly.  There is no blocks or rotation of either hip.  I did give him a prescription for considering a custom insert for his right shoe.  From my standpoint, the next time we need to see him is not for 6 months unless he is having issues.  At that visit we will have a standing low AP pelvis.

## 2023-09-27 ENCOUNTER — Telehealth: Payer: Self-pay | Admitting: *Deleted

## 2023-09-27 NOTE — Telephone Encounter (Signed)
Ortho bundle 90 day call completed. 

## 2023-10-25 DIAGNOSIS — H25813 Combined forms of age-related cataract, bilateral: Secondary | ICD-10-CM | POA: Diagnosis not present

## 2024-01-30 ENCOUNTER — Other Ambulatory Visit (INDEPENDENT_AMBULATORY_CARE_PROVIDER_SITE_OTHER): Payer: Self-pay

## 2024-01-30 ENCOUNTER — Encounter: Payer: Self-pay | Admitting: Orthopaedic Surgery

## 2024-01-30 ENCOUNTER — Ambulatory Visit: Payer: Medicare HMO | Admitting: Orthopaedic Surgery

## 2024-01-30 DIAGNOSIS — Z96643 Presence of artificial hip joint, bilateral: Secondary | ICD-10-CM | POA: Diagnosis not present

## 2024-01-30 NOTE — Progress Notes (Signed)
The patient is now 7 months status post her left total hip replacement.  We replaced his right hip back in 2018.  He has severe deformity of the right hip and significant arthritis of the left hip.  He is 79 years old.  He says his only difficulty is going up steps when he gets some pain in the right hip and some soreness.  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  Both hips move smoothly and fluidly.  There is no blocks or rotation.  He is slightly shorter on the right than the left.  He does let me know that he is able to walk more easily and perform his ADLs more easily.  An AP pelvis standing shows both hips in good position with no complicating features.  At this point follow-up can be as needed since he is doing well.  If he does develop any issues at all he knows to let us know.

## 2024-02-10 DIAGNOSIS — Z7689 Persons encountering health services in other specified circumstances: Secondary | ICD-10-CM | POA: Diagnosis not present

## 2024-02-10 DIAGNOSIS — I480 Paroxysmal atrial fibrillation: Secondary | ICD-10-CM | POA: Diagnosis not present

## 2024-02-10 DIAGNOSIS — E785 Hyperlipidemia, unspecified: Secondary | ICD-10-CM | POA: Diagnosis not present

## 2024-02-10 DIAGNOSIS — I10 Essential (primary) hypertension: Secondary | ICD-10-CM | POA: Diagnosis not present

## 2024-02-10 DIAGNOSIS — I6521 Occlusion and stenosis of right carotid artery: Secondary | ICD-10-CM | POA: Diagnosis not present

## 2024-07-17 DIAGNOSIS — H9192 Unspecified hearing loss, left ear: Secondary | ICD-10-CM | POA: Diagnosis not present

## 2024-07-17 DIAGNOSIS — H6990 Unspecified Eustachian tube disorder, unspecified ear: Secondary | ICD-10-CM | POA: Diagnosis not present

## 2024-10-19 DIAGNOSIS — H269 Unspecified cataract: Secondary | ICD-10-CM | POA: Diagnosis not present

## 2024-10-19 DIAGNOSIS — N189 Chronic kidney disease, unspecified: Secondary | ICD-10-CM | POA: Diagnosis not present

## 2024-10-19 DIAGNOSIS — I129 Hypertensive chronic kidney disease with stage 1 through stage 4 chronic kidney disease, or unspecified chronic kidney disease: Secondary | ICD-10-CM | POA: Diagnosis not present

## 2024-10-19 DIAGNOSIS — D6869 Other thrombophilia: Secondary | ICD-10-CM | POA: Diagnosis not present

## 2024-10-19 DIAGNOSIS — Z7982 Long term (current) use of aspirin: Secondary | ICD-10-CM | POA: Diagnosis not present

## 2024-10-19 DIAGNOSIS — E785 Hyperlipidemia, unspecified: Secondary | ICD-10-CM | POA: Diagnosis not present

## 2024-10-19 DIAGNOSIS — M1990 Unspecified osteoarthritis, unspecified site: Secondary | ICD-10-CM | POA: Diagnosis not present

## 2024-10-19 DIAGNOSIS — I4891 Unspecified atrial fibrillation: Secondary | ICD-10-CM | POA: Diagnosis not present

## 2024-10-19 DIAGNOSIS — I099 Rheumatic heart disease, unspecified: Secondary | ICD-10-CM | POA: Diagnosis not present

## 2024-10-29 DIAGNOSIS — H25813 Combined forms of age-related cataract, bilateral: Secondary | ICD-10-CM | POA: Diagnosis not present
